# Patient Record
Sex: Female | Born: 1950 | Race: White | Hispanic: No | Marital: Married | Smoking: Never smoker
Health system: Southern US, Community
[De-identification: ages and names within clinical notes are randomized; demographics above are authoritative.]

## PROBLEM LIST (undated history)

## (undated) DIAGNOSIS — N200 Calculus of kidney: Secondary | ICD-10-CM

## (undated) DIAGNOSIS — E785 Hyperlipidemia, unspecified: Secondary | ICD-10-CM

## (undated) DIAGNOSIS — F329 Major depressive disorder, single episode, unspecified: Secondary | ICD-10-CM

## (undated) DIAGNOSIS — F32A Depression, unspecified: Secondary | ICD-10-CM

## (undated) DIAGNOSIS — M179 Osteoarthritis of knee, unspecified: Secondary | ICD-10-CM

## (undated) DIAGNOSIS — I10 Essential (primary) hypertension: Secondary | ICD-10-CM

## (undated) DIAGNOSIS — R42 Dizziness and giddiness: Secondary | ICD-10-CM

## (undated) DIAGNOSIS — N1831 Chronic kidney disease, stage 3a: Secondary | ICD-10-CM

## (undated) DIAGNOSIS — M1990 Unspecified osteoarthritis, unspecified site: Secondary | ICD-10-CM

## (undated) DIAGNOSIS — C801 Malignant (primary) neoplasm, unspecified: Secondary | ICD-10-CM

## (undated) DIAGNOSIS — M189 Osteoarthritis of first carpometacarpal joint, unspecified: Secondary | ICD-10-CM

## (undated) DIAGNOSIS — E7800 Pure hypercholesterolemia, unspecified: Secondary | ICD-10-CM

## (undated) DIAGNOSIS — F3342 Major depressive disorder, recurrent, in full remission: Secondary | ICD-10-CM

## (undated) DIAGNOSIS — G720 Drug-induced myopathy: Secondary | ICD-10-CM

## (undated) DIAGNOSIS — L309 Dermatitis, unspecified: Secondary | ICD-10-CM

## (undated) DIAGNOSIS — K625 Hemorrhage of anus and rectum: Secondary | ICD-10-CM

## (undated) DIAGNOSIS — M545 Low back pain, unspecified: Secondary | ICD-10-CM

## (undated) DIAGNOSIS — E113299 Type 2 diabetes mellitus with mild nonproliferative diabetic retinopathy without macular edema, unspecified eye: Secondary | ICD-10-CM

## (undated) DIAGNOSIS — E559 Vitamin D deficiency, unspecified: Secondary | ICD-10-CM

## (undated) DIAGNOSIS — E08319 Diabetes mellitus due to underlying condition with unspecified diabetic retinopathy without macular edema: Secondary | ICD-10-CM

## (undated) DIAGNOSIS — F409 Phobic anxiety disorder, unspecified: Secondary | ICD-10-CM

## (undated) DIAGNOSIS — K219 Gastro-esophageal reflux disease without esophagitis: Secondary | ICD-10-CM

## (undated) DIAGNOSIS — G43909 Migraine, unspecified, not intractable, without status migrainosus: Secondary | ICD-10-CM

## (undated) HISTORY — DX: Low back pain, unspecified: M54.50

## (undated) HISTORY — DX: Dizziness and giddiness: R42

## (undated) HISTORY — DX: Depression, unspecified: F32.A

## (undated) HISTORY — DX: Osteoarthritis of knee, unspecified: M17.9

## (undated) HISTORY — DX: Unspecified osteoarthritis, unspecified site: M19.90

## (undated) HISTORY — PX: INCONTINENCE SURGERY: SHX676

## (undated) HISTORY — DX: Major depressive disorder, recurrent, in full remission: F33.42

## (undated) HISTORY — DX: Malignant (primary) neoplasm, unspecified: C80.1

## (undated) HISTORY — PX: CHOLECYSTECTOMY: SHX55

## (undated) HISTORY — DX: Diabetes mellitus due to underlying condition with unspecified diabetic retinopathy without macular edema: E08.319

## (undated) HISTORY — PX: INSERTION OF TISSUE EXPANDER AFTER MASTECTOMY: SHX1831

## (undated) HISTORY — DX: Essential (primary) hypertension: I10

## (undated) HISTORY — DX: Osteoarthritis of first carpometacarpal joint, unspecified: M18.9

## (undated) HISTORY — PX: SPINE SURGERY: SHX786

## (undated) HISTORY — PX: TUBAL LIGATION: SHX77

## (undated) HISTORY — DX: Migraine, unspecified, not intractable, without status migrainosus: G43.909

## (undated) HISTORY — PX: JOINT REPLACEMENT: SHX530

## (undated) HISTORY — PX: APPENDECTOMY: SHX54

## (undated) HISTORY — DX: Chronic kidney disease, stage 3a: N18.31

## (undated) HISTORY — DX: Hemorrhage of anus and rectum: K62.5

## (undated) HISTORY — PX: BREAST RECONSTRUCTION: SHX9

## (undated) HISTORY — DX: Dermatitis, unspecified: L30.9

## (undated) HISTORY — PX: CERVICAL DISC SURGERY: SHX588

## (undated) HISTORY — DX: Vitamin D deficiency, unspecified: E55.9

## (undated) HISTORY — PX: FOREARM FRACTURE SURGERY: SHX649

## (undated) HISTORY — DX: Major depressive disorder, single episode, unspecified: F32.9

## (undated) HISTORY — PX: KNEE ARTHROSCOPY: SUR90

## (undated) HISTORY — PX: BREAST REDUCTION SURGERY: SHX8

## (undated) HISTORY — DX: Gastro-esophageal reflux disease without esophagitis: K21.9

## (undated) HISTORY — DX: Type 2 diabetes mellitus with mild nonproliferative diabetic retinopathy without macular edema, unspecified eye: E11.3299

## (undated) HISTORY — PX: BREAST SURGERY: SHX581

## (undated) HISTORY — DX: Hyperlipidemia, unspecified: E78.5

## (undated) HISTORY — PX: MASTECTOMY: SHX3

## (undated) HISTORY — DX: Phobic anxiety disorder, unspecified: F40.9

## (undated) HISTORY — DX: Drug-induced myopathy: G72.0

## (undated) HISTORY — PX: TOTAL KNEE ARTHROPLASTY: SHX125

## (undated) HISTORY — DX: Pure hypercholesterolemia, unspecified: E78.00

## (undated) HISTORY — PX: NEUROPLASTY / TRANSPOSITION MEDIAN NERVE AT CARPAL TUNNEL BILATERAL: SUR894

## (undated) HISTORY — PX: REDUCTION MAMMAPLASTY: SUR839

---

## 1994-11-26 DIAGNOSIS — C50919 Malignant neoplasm of unspecified site of unspecified female breast: Secondary | ICD-10-CM

## 1994-11-26 DIAGNOSIS — C801 Malignant (primary) neoplasm, unspecified: Secondary | ICD-10-CM

## 1994-11-26 HISTORY — DX: Malignant (primary) neoplasm, unspecified: C80.1

## 1994-11-26 HISTORY — DX: Malignant neoplasm of unspecified site of unspecified female breast: C50.919

## 1994-11-26 HISTORY — PX: MASTECTOMY: SHX3

## 1998-02-24 ENCOUNTER — Other Ambulatory Visit: Admission: RE | Admit: 1998-02-24 | Discharge: 1998-02-24 | Payer: Self-pay | Admitting: Plastic Surgery

## 1998-04-04 ENCOUNTER — Encounter: Admission: RE | Admit: 1998-04-04 | Discharge: 1998-07-03 | Payer: Self-pay | Admitting: Internal Medicine

## 1998-06-17 ENCOUNTER — Ambulatory Visit (HOSPITAL_COMMUNITY): Admission: RE | Admit: 1998-06-17 | Discharge: 1998-06-17 | Payer: Self-pay | Admitting: Surgery

## 1998-08-16 ENCOUNTER — Encounter: Admission: RE | Admit: 1998-08-16 | Discharge: 1998-11-14 | Payer: Self-pay | Admitting: Internal Medicine

## 1999-04-02 ENCOUNTER — Other Ambulatory Visit: Admission: RE | Admit: 1999-04-02 | Discharge: 1999-04-02 | Payer: Self-pay | Admitting: Internal Medicine

## 1999-05-23 ENCOUNTER — Ambulatory Visit (HOSPITAL_COMMUNITY): Admission: RE | Admit: 1999-05-23 | Discharge: 1999-05-23 | Payer: Self-pay | Admitting: Internal Medicine

## 1999-05-23 ENCOUNTER — Encounter: Payer: Self-pay | Admitting: Internal Medicine

## 1999-10-07 ENCOUNTER — Ambulatory Visit (HOSPITAL_COMMUNITY): Admission: RE | Admit: 1999-10-07 | Discharge: 1999-10-07 | Payer: Self-pay | Admitting: Neurosurgery

## 1999-10-07 ENCOUNTER — Encounter: Payer: Self-pay | Admitting: Neurosurgery

## 2000-04-23 ENCOUNTER — Encounter: Payer: Self-pay | Admitting: Emergency Medicine

## 2000-04-23 ENCOUNTER — Emergency Department (HOSPITAL_COMMUNITY): Admission: EM | Admit: 2000-04-23 | Discharge: 2000-04-23 | Payer: Self-pay | Admitting: Emergency Medicine

## 2000-05-16 ENCOUNTER — Other Ambulatory Visit: Admission: RE | Admit: 2000-05-16 | Discharge: 2000-05-16 | Payer: Self-pay | Admitting: Obstetrics and Gynecology

## 2000-05-24 ENCOUNTER — Encounter: Admission: RE | Admit: 2000-05-24 | Discharge: 2000-05-24 | Payer: Self-pay | Admitting: Internal Medicine

## 2000-05-24 ENCOUNTER — Encounter: Payer: Self-pay | Admitting: Internal Medicine

## 2000-12-29 ENCOUNTER — Ambulatory Visit (HOSPITAL_COMMUNITY): Admission: RE | Admit: 2000-12-29 | Discharge: 2000-12-29 | Payer: Self-pay | Admitting: Neurosurgery

## 2000-12-29 ENCOUNTER — Encounter: Payer: Self-pay | Admitting: Neurosurgery

## 2001-01-08 ENCOUNTER — Encounter: Payer: Self-pay | Admitting: Neurosurgery

## 2001-01-10 ENCOUNTER — Encounter: Payer: Self-pay | Admitting: Neurosurgery

## 2001-01-10 ENCOUNTER — Inpatient Hospital Stay (HOSPITAL_COMMUNITY): Admission: RE | Admit: 2001-01-10 | Discharge: 2001-01-12 | Payer: Self-pay | Admitting: Neurosurgery

## 2001-04-08 ENCOUNTER — Encounter: Admission: RE | Admit: 2001-04-08 | Discharge: 2001-04-08 | Payer: Self-pay | Admitting: Internal Medicine

## 2001-04-08 ENCOUNTER — Encounter: Payer: Self-pay | Admitting: Internal Medicine

## 2001-05-26 ENCOUNTER — Encounter: Payer: Self-pay | Admitting: Internal Medicine

## 2001-05-26 ENCOUNTER — Ambulatory Visit (HOSPITAL_COMMUNITY): Admission: RE | Admit: 2001-05-26 | Discharge: 2001-05-26 | Payer: Self-pay | Admitting: Internal Medicine

## 2001-08-30 ENCOUNTER — Ambulatory Visit (HOSPITAL_COMMUNITY): Admission: RE | Admit: 2001-08-30 | Discharge: 2001-08-30 | Payer: Self-pay | Admitting: Neurosurgery

## 2001-08-30 ENCOUNTER — Encounter: Payer: Self-pay | Admitting: Neurosurgery

## 2002-04-22 ENCOUNTER — Other Ambulatory Visit: Admission: RE | Admit: 2002-04-22 | Discharge: 2002-04-22 | Payer: Self-pay | Admitting: Internal Medicine

## 2002-05-27 ENCOUNTER — Ambulatory Visit (HOSPITAL_COMMUNITY): Admission: RE | Admit: 2002-05-27 | Discharge: 2002-05-27 | Payer: Self-pay | Admitting: Internal Medicine

## 2002-05-27 ENCOUNTER — Encounter: Payer: Self-pay | Admitting: Internal Medicine

## 2003-03-07 ENCOUNTER — Emergency Department (HOSPITAL_COMMUNITY): Admission: AD | Admit: 2003-03-07 | Discharge: 2003-03-07 | Payer: Self-pay | Admitting: Emergency Medicine

## 2003-03-20 ENCOUNTER — Encounter: Payer: Self-pay | Admitting: Neurosurgery

## 2003-03-20 ENCOUNTER — Ambulatory Visit (HOSPITAL_COMMUNITY): Admission: RE | Admit: 2003-03-20 | Discharge: 2003-03-20 | Payer: Self-pay | Admitting: Neurosurgery

## 2003-04-07 ENCOUNTER — Ambulatory Visit (HOSPITAL_COMMUNITY): Admission: RE | Admit: 2003-04-07 | Discharge: 2003-04-07 | Payer: Self-pay | Admitting: Neurosurgery

## 2003-04-07 ENCOUNTER — Encounter: Payer: Self-pay | Admitting: Neurosurgery

## 2003-06-10 ENCOUNTER — Ambulatory Visit (HOSPITAL_COMMUNITY): Admission: RE | Admit: 2003-06-10 | Discharge: 2003-06-10 | Payer: Self-pay | Admitting: Internal Medicine

## 2003-06-10 ENCOUNTER — Encounter: Payer: Self-pay | Admitting: Internal Medicine

## 2003-08-13 ENCOUNTER — Other Ambulatory Visit: Admission: RE | Admit: 2003-08-13 | Discharge: 2003-08-13 | Payer: Self-pay | Admitting: Internal Medicine

## 2004-06-13 ENCOUNTER — Ambulatory Visit (HOSPITAL_COMMUNITY): Admission: RE | Admit: 2004-06-13 | Discharge: 2004-06-13 | Payer: Self-pay | Admitting: Internal Medicine

## 2004-08-12 ENCOUNTER — Emergency Department (HOSPITAL_COMMUNITY): Admission: EM | Admit: 2004-08-12 | Discharge: 2004-08-12 | Payer: Self-pay | Admitting: Emergency Medicine

## 2004-08-21 ENCOUNTER — Other Ambulatory Visit: Admission: RE | Admit: 2004-08-21 | Discharge: 2004-08-21 | Payer: Self-pay | Admitting: Internal Medicine

## 2004-09-21 ENCOUNTER — Ambulatory Visit (HOSPITAL_BASED_OUTPATIENT_CLINIC_OR_DEPARTMENT_OTHER): Admission: RE | Admit: 2004-09-21 | Discharge: 2004-09-21 | Payer: Self-pay | Admitting: Orthopedic Surgery

## 2004-09-21 ENCOUNTER — Ambulatory Visit (HOSPITAL_COMMUNITY): Admission: RE | Admit: 2004-09-21 | Discharge: 2004-09-21 | Payer: Self-pay | Admitting: Orthopedic Surgery

## 2005-01-23 ENCOUNTER — Ambulatory Visit (HOSPITAL_COMMUNITY): Admission: RE | Admit: 2005-01-23 | Discharge: 2005-01-23 | Payer: Self-pay | Admitting: Orthopedic Surgery

## 2005-01-23 ENCOUNTER — Ambulatory Visit (HOSPITAL_BASED_OUTPATIENT_CLINIC_OR_DEPARTMENT_OTHER): Admission: RE | Admit: 2005-01-23 | Discharge: 2005-01-23 | Payer: Self-pay | Admitting: Orthopedic Surgery

## 2005-06-18 ENCOUNTER — Ambulatory Visit (HOSPITAL_COMMUNITY): Admission: RE | Admit: 2005-06-18 | Discharge: 2005-06-18 | Payer: Self-pay | Admitting: Surgery

## 2005-12-25 ENCOUNTER — Other Ambulatory Visit: Admission: RE | Admit: 2005-12-25 | Discharge: 2005-12-25 | Payer: Self-pay | Admitting: Internal Medicine

## 2006-07-02 ENCOUNTER — Ambulatory Visit (HOSPITAL_COMMUNITY): Admission: RE | Admit: 2006-07-02 | Discharge: 2006-07-02 | Payer: Self-pay | Admitting: Obstetrics and Gynecology

## 2007-04-28 ENCOUNTER — Other Ambulatory Visit: Admission: RE | Admit: 2007-04-28 | Discharge: 2007-04-28 | Payer: Self-pay | Admitting: Internal Medicine

## 2007-07-29 ENCOUNTER — Ambulatory Visit (HOSPITAL_COMMUNITY): Admission: RE | Admit: 2007-07-29 | Discharge: 2007-07-29 | Payer: Self-pay | Admitting: Surgery

## 2008-03-31 ENCOUNTER — Inpatient Hospital Stay (HOSPITAL_COMMUNITY): Admission: RE | Admit: 2008-03-31 | Discharge: 2008-04-04 | Payer: Self-pay | Admitting: Specialist

## 2008-04-30 ENCOUNTER — Other Ambulatory Visit: Admission: RE | Admit: 2008-04-30 | Discharge: 2008-04-30 | Payer: Self-pay | Admitting: Internal Medicine

## 2008-06-21 ENCOUNTER — Encounter: Admission: RE | Admit: 2008-06-21 | Discharge: 2008-06-21 | Payer: Self-pay | Admitting: Specialist

## 2008-07-30 ENCOUNTER — Ambulatory Visit (HOSPITAL_COMMUNITY): Admission: RE | Admit: 2008-07-30 | Discharge: 2008-07-30 | Payer: Self-pay | Admitting: Family Medicine

## 2009-11-02 ENCOUNTER — Ambulatory Visit (HOSPITAL_COMMUNITY): Admission: RE | Admit: 2009-11-02 | Discharge: 2009-11-02 | Payer: Self-pay | Admitting: Internal Medicine

## 2009-11-04 ENCOUNTER — Encounter: Admission: RE | Admit: 2009-11-04 | Discharge: 2009-11-04 | Payer: Self-pay | Admitting: Obstetrics and Gynecology

## 2010-10-25 ENCOUNTER — Encounter: Admission: RE | Admit: 2010-10-25 | Discharge: 2010-10-25 | Payer: Self-pay | Admitting: Internal Medicine

## 2010-11-16 ENCOUNTER — Encounter
Admission: RE | Admit: 2010-11-16 | Discharge: 2010-11-16 | Payer: Self-pay | Source: Home / Self Care | Attending: Internal Medicine | Admitting: Internal Medicine

## 2010-12-21 LAB — BASIC METABOLIC PANEL
BUN: 14 mg/dL (ref 6–23)
CO2: 30 mEq/L (ref 19–32)
Calcium: 9.8 mg/dL (ref 8.4–10.5)
Chloride: 103 mEq/L (ref 96–112)
Creatinine, Ser: 0.76 mg/dL (ref 0.4–1.2)
GFR calc Af Amer: >60 mL/min (ref >60)
GFR calc non Af Amer: >60 mL/min (ref >60)
Glucose, Bld: 259 mg/dL — ABNORMAL HIGH (ref 70–99)
Potassium: 4.9 mEq/L (ref 3.5–5.1)
Sodium: 139 mEq/L (ref 135–145)

## 2010-12-21 LAB — CBC
MCV: 85.5 fL (ref 78.0–100.0)
RBC: 4.97 MIL/uL (ref 3.87–5.11)
RDW: 13.5 % (ref 11.5–15.5)

## 2010-12-21 LAB — DIFFERENTIAL
Basophils Relative: 1 % (ref 0–1)
Eosinophils Absolute: 0.8 10*3/uL — ABNORMAL HIGH (ref 0.0–0.7)
Neutro Abs: 4.1 10*3/uL (ref 1.7–7.7)
Neutrophils Relative %: 51 % (ref 43–77)

## 2010-12-21 LAB — SURGICAL PCR SCREEN
MRSA, PCR: NEGATIVE
Staphylococcus aureus: NEGATIVE

## 2010-12-21 LAB — TYPE AND SCREEN
ABO/RH(D): A POS
Antibody Screen: NEGATIVE

## 2010-12-26 ENCOUNTER — Inpatient Hospital Stay (HOSPITAL_COMMUNITY)
Admission: RE | Admit: 2010-12-26 | Discharge: 2011-01-02 | DRG: 460 | Disposition: A | Discharge status: Home Health | Payer: Medicare Other | Attending: Neurosurgery | Admitting: Neurosurgery

## 2010-12-26 DIAGNOSIS — K219 Gastro-esophageal reflux disease without esophagitis: Secondary | ICD-10-CM | POA: Diagnosis present

## 2010-12-26 DIAGNOSIS — M4310 Spondylolisthesis, site unspecified: Secondary | ICD-10-CM | POA: Diagnosis present

## 2010-12-26 DIAGNOSIS — F3289 Other specified depressive episodes: Secondary | ICD-10-CM | POA: Diagnosis present

## 2010-12-26 DIAGNOSIS — F329 Major depressive disorder, single episode, unspecified: Secondary | ICD-10-CM | POA: Diagnosis present

## 2010-12-26 DIAGNOSIS — M5126 Other intervertebral disc displacement, lumbar region: Principal | ICD-10-CM | POA: Diagnosis present

## 2010-12-26 DIAGNOSIS — M47817 Spondylosis without myelopathy or radiculopathy, lumbosacral region: Secondary | ICD-10-CM | POA: Diagnosis present

## 2010-12-26 DIAGNOSIS — I10 Essential (primary) hypertension: Secondary | ICD-10-CM | POA: Diagnosis present

## 2010-12-26 DIAGNOSIS — E119 Type 2 diabetes mellitus without complications: Secondary | ICD-10-CM | POA: Diagnosis present

## 2010-12-26 LAB — GLUCOSE, CAPILLARY: Glucose-Capillary: 152 mg/dL — ABNORMAL HIGH (ref 70–99)

## 2010-12-27 LAB — GLUCOSE, CAPILLARY
Glucose-Capillary: 217 mg/dL — ABNORMAL HIGH (ref 70–99)
Glucose-Capillary: 187 mg/dL — ABNORMAL HIGH (ref 70–99)

## 2010-12-28 LAB — GLUCOSE, CAPILLARY
Glucose-Capillary: 241 mg/dL — ABNORMAL HIGH (ref 70–99)
Glucose-Capillary: 223 mg/dL — ABNORMAL HIGH (ref 70–99)

## 2010-12-28 NOTE — Op Note (Addendum)
[name], [name]              ACCOUNT NO.:  [id number]  MEDICAL RECORD NO.:  [id number]          PATIENT TYPE:  INP  LOCATION:  3010                         FACILITY:  [facility]  PHYSICIAN:  Danae Orleans. Venetia Maxon, M.D.  DATE OF BIRTH:  1951-08-22  DATE OF PROCEDURE:  12/26/2010 DATE OF DISCHARGE:                              OPERATIVE REPORT   PREOPERATIVE DIAGNOSES:  Recurrent herniated lumbar disk with spondylosis, degenerative disk disease, and radiculopathy L2-3, L3-4, L4- 5 level.  POSTOPERATIVE DIAGNOSES:  Recurrent herniated lumbar disk with spondylosis, degenerative disk disease, and radiculopathy L2-3, L3-4, L4- 5 level.  PROCEDURE: 1. L2-3, L3-4, L4-5 redo laminectomy, and diskectomy. 2. Transforaminal lumbar interbody fusion with PEEK interbody cages,     morselized bone autograft, Puragen, and allograft. 3. Pedicle screw fixation L2-L5 levels bilaterally. 4. Posterolateral arthrodesis L2-L5 levels.  SURGEON:  Danae Orleans. Venetia Maxon, MD  ASSISTANTRejeana Brock, RN and Stefani Dama, MD  ANESTHESIA:  General endotracheal anesthesia.  ESTIMATED BLOOD LOSS:  550 mL of blood with 170 mL of Cell Saver blood returned to the patient.  COMPLICATIONS:  None.  DISPOSITION:  Recovery.  INDICATIONS:  [name] is a 60 year old woman with severe spondylosis and spondylolisthesis of L3-L4 with large calcified disk herniation L2-3 on the right with severe right leg pain and recurrent disk herniation L4-5 on the right, elected to take her to surgery for redo decompression and fusion at these affected levels.  PROCEDURE:  [name] was brought to the operating room.  Following satisfactory and uncomplicated induction of general endotracheal anesthesia plus intravenous lines and Foley catheter, the patient was placed in prone position on the [place] table.  Soft tissue and bony prominences were padded appropriately.  The area of planned incision was infiltrated with local  lidocaine.  Incision was made in the midline, carried from L2-L5 levels.  Transverse processes of L3, L4, and L5 were exposed bilaterally.  The right laminectomy at L2, L3, and L4 was performed.  There was significant scarring around the thecal sac at the L4-5 level on the right and this was carefully dissected.  A thorough diskectomy was performed and eventually after preparation of endplates and a thorough decompression of the nerve roots, more extensive for typical decompression and fusion, a 9-mm PEEK interbody spacer was packed with Puragen with allograft and also the autograft was placed at the interspace and countersunk and the implant was countersunk appropriately.  At the L3-4 level, a thorough diskectomy was performed. Decompression from the right side was performed.  Contralateral distraction was also performed and the endplates were prepared and a 10 mm medium PEEK interbody cage was again packed with Puragen and allograft.  Additional autograft was placed deep within the interspace and countersunk appropriately.  An implant was placed and countersunk appropriately.  At the L2-3 level, there was thorough decompression and diskectomy was performed.  The disk herniation was markedly calcified and adherent to the dura.  With painstaking dissection, the dura was separated from these calcified disk herniation.  A small opening in the arachnoid was identified.  The thorough diskectomy was performed and after trial sizing, a 10-mm  a PEEK interbody cage was again packed in similar fashion with autograft deep in the interspace and this implant was then positioned and countersunk appropriately.  There was some CSF leakage at this point from the exposed arachnoid and DuraGen was placed. Subsequently, Dura Seal was placed with no evidence of any persistent CSF leakage.  Pedicle screws were then placed using 6.5 x 45-mm screws at L2, L3, L4, and 6.5 x 40-mm screws at L5.  All screws had  excellent purchase.  The position was confirmed on AP and lateral fluoroscopy. The posterolateral region was extensively decorticated along with the facet joint complexes on the left and remaining bone autograft and Vitoss were then placed and tamped into position.  A 110-mm rods were lordosed appropriately and affixed to the screw heads.  Locking caps were placed and counter torqued appropriately.  The wound was closed after it had been irrigated prior to placing final bone graft and closed with 1 Vicryl sutures, reapproximated fascia, 2-0 Vicryl subcutaneous tissues, and 3-0 nylon running lock stitch, reapproximated skin edges. The patient was extubated in the operating room and taken to recovery in stable satisfactory condition having tolerated the operation well. Counts were correct at the end of the case.     Danae Orleans. Venetia Maxon, M.D.     JDS/MEDQ  D:  12/26/2010  T:  12/27/2010  Job:  [id number]  Electronically Signed by Maeola Harman M.D. on 12/28/2010 02:07:54 PM

## 2010-12-29 LAB — GLUCOSE, CAPILLARY: Glucose-Capillary: 250 mg/dL — ABNORMAL HIGH (ref 70–99)

## 2010-12-30 LAB — GLUCOSE, CAPILLARY
Glucose-Capillary: 196 mg/dL — ABNORMAL HIGH (ref 70–99)
Glucose-Capillary: 189 mg/dL — ABNORMAL HIGH (ref 70–99)
Glucose-Capillary: 222 mg/dL — ABNORMAL HIGH (ref 70–99)

## 2010-12-31 LAB — GLUCOSE, CAPILLARY
Glucose-Capillary: 163 mg/dL — ABNORMAL HIGH (ref 70–99)
Glucose-Capillary: 177 mg/dL — ABNORMAL HIGH (ref 70–99)
Glucose-Capillary: 139 mg/dL — ABNORMAL HIGH (ref 70–99)

## 2011-01-01 LAB — GLUCOSE, CAPILLARY
Glucose-Capillary: 190 mg/dL — ABNORMAL HIGH (ref 70–99)
Glucose-Capillary: 212 mg/dL — ABNORMAL HIGH (ref 70–99)

## 2011-02-06 ENCOUNTER — Observation Stay (HOSPITAL_COMMUNITY)
Admission: EM | Admit: 2011-02-06 | Discharge: 2011-02-07 | Disposition: A | Discharge status: Home / Self Care | Payer: Medicare Other | Attending: Internal Medicine | Admitting: Internal Medicine

## 2011-02-06 ENCOUNTER — Emergency Department (HOSPITAL_COMMUNITY): Payer: Medicare Other

## 2011-02-06 DIAGNOSIS — R0609 Other forms of dyspnea: Secondary | ICD-10-CM | POA: Insufficient documentation

## 2011-02-06 DIAGNOSIS — R61 Generalized hyperhidrosis: Secondary | ICD-10-CM | POA: Insufficient documentation

## 2011-02-06 DIAGNOSIS — F411 Generalized anxiety disorder: Secondary | ICD-10-CM | POA: Insufficient documentation

## 2011-02-06 DIAGNOSIS — K219 Gastro-esophageal reflux disease without esophagitis: Secondary | ICD-10-CM | POA: Insufficient documentation

## 2011-02-06 DIAGNOSIS — E785 Hyperlipidemia, unspecified: Secondary | ICD-10-CM | POA: Insufficient documentation

## 2011-02-06 DIAGNOSIS — R0789 Other chest pain: Principal | ICD-10-CM | POA: Insufficient documentation

## 2011-02-06 DIAGNOSIS — I10 Essential (primary) hypertension: Secondary | ICD-10-CM | POA: Insufficient documentation

## 2011-02-06 DIAGNOSIS — R0602 Shortness of breath: Secondary | ICD-10-CM | POA: Insufficient documentation

## 2011-02-06 DIAGNOSIS — F3289 Other specified depressive episodes: Secondary | ICD-10-CM | POA: Insufficient documentation

## 2011-02-06 DIAGNOSIS — E669 Obesity, unspecified: Secondary | ICD-10-CM | POA: Insufficient documentation

## 2011-02-06 DIAGNOSIS — F329 Major depressive disorder, single episode, unspecified: Secondary | ICD-10-CM | POA: Insufficient documentation

## 2011-02-06 DIAGNOSIS — E119 Type 2 diabetes mellitus without complications: Secondary | ICD-10-CM | POA: Insufficient documentation

## 2011-02-06 DIAGNOSIS — D649 Anemia, unspecified: Secondary | ICD-10-CM | POA: Insufficient documentation

## 2011-02-06 DIAGNOSIS — R0989 Other specified symptoms and signs involving the circulatory and respiratory systems: Secondary | ICD-10-CM | POA: Insufficient documentation

## 2011-02-06 DIAGNOSIS — Z853 Personal history of malignant neoplasm of breast: Secondary | ICD-10-CM | POA: Insufficient documentation

## 2011-02-06 LAB — POCT CARDIAC MARKERS
CKMB, poc: 1.2 ng/mL (ref 1.0–8.0)
CKMB, poc: 1.6 ng/mL (ref 1.0–8.0)
Myoglobin, poc: 120 ng/mL (ref 12–200)
Myoglobin, poc: 107 ng/mL (ref 12–200)

## 2011-02-06 LAB — DIFFERENTIAL
Basophils Absolute: 0 10*3/uL (ref 0.0–0.1)
Basophils Relative: 1 % (ref 0–1)
Eosinophils Absolute: 0.2 10*3/uL (ref 0.0–0.7)
Eosinophils Relative: 3 % (ref 0–5)
Lymphocytes Relative: 33 % (ref 12–46)
Monocytes Absolute: 0.7 10*3/uL (ref 0.1–1.0)

## 2011-02-06 LAB — BASIC METABOLIC PANEL
BUN: 8 mg/dL (ref 6–23)
CO2: 25 mEq/L (ref 19–32)
Calcium: 9.2 mg/dL (ref 8.4–10.5)
Creatinine, Ser: 0.64 mg/dL (ref 0.4–1.2)
GFR calc non Af Amer: >60 mL/min (ref >60)
Glucose, Bld: 167 mg/dL — ABNORMAL HIGH (ref 70–99)
Sodium: 139 mEq/L (ref 135–145)

## 2011-02-06 LAB — CBC
HCT: 33.3 % — ABNORMAL LOW (ref 36.0–46.0)
MCHC: 33 g/dL (ref 30.0–36.0)
Platelets: 266 10*3/uL (ref 150–400)
RDW: 13.5 % (ref 11.5–15.5)
WBC: 7.2 10*3/uL (ref 4.0–10.5)

## 2011-02-06 LAB — CK TOTAL AND CKMB (NOT AT ARMC): CK, MB: 4 ng/mL (ref 0.3–4.0)

## 2011-02-06 MED ORDER — IOHEXOL 300 MG/ML  SOLN
100.0000 mL | Freq: Once | INTRAMUSCULAR | Status: AC | PRN
  Administered 2011-02-06: 100 mL via INTRAVENOUS

## 2011-02-06 NOTE — Discharge Summary (Signed)
  NAMEFAITHLYNN, DEELEY              ACCOUNT NO.:  [id number]  MEDICAL RECORD NO.:  [id number]           PATIENT TYPE:  I  LOCATION:  3010                         FACILITY:  [facility]  PHYSICIAN:  Danae Orleans. Venetia Maxon, M.D.  DATE OF BIRTH:  [birth year]  DATE OF ADMISSION:  12/26/2010 DATE OF DISCHARGE:  01/02/2011                              DISCHARGE SUMMARY   REASON FOR ADMISSION:  [name] is a 60 year old woman with severe lumbar spondylosis and spondylolisthesis at L3-L4 with a large calcified disk herniation at L2-L3 on the right with severe right-sided leg pain and recurrent disk herniation at L4-L5 on the right.  She has had multiple prior lumbar laminectomy and diskectomy surgeries done by other physicians.  It was elected to admit her for with a diagnosis of recurrent disk herniation, spondylosis, degenerative disk disease, and radiculopathy at L2-L3, L3-L4, and L4-L5 levels.  HOSPITAL COURSE:  The patient underwent decompression and fusion at these levels with interbody graft and pedicle screw fixation.  Her dura at the L2-L3 level was markedly adherent to the calcified disk herniation and a small opening was created in the dura with CSF leak. It was felt that this was not repairable as this was on the ventral surface of the thecal sac and it was covered with Duragen and DuraSeal. It was elected to have the patient lay flat for several days to allow this to heal prior to mobilization.  The patient did well with that, was on bedrest for 72 hours, and then was gradually mobilized, had a Foley catheter discontinued and was doing well in terms of back pain and was mobilized with physical therapy and was then discharged home on the morning of January 31, 2011 with home health physical and occupational therapy with discharge medications of Percocet 5/325 one to two every 4 hours as needed for pain and Valium 5 mg every 8 hours as needed for muscular spasm.  She is to call for an  appointment in a week for suture removal.  The patient did well and was discharged home with instructions to follow up in 1 week for suture removal.     Danae Orleans. Venetia Maxon, M.D.     JDS/MEDQ  D:  02/02/2011  T:  02/03/2011  Job:  [id number]  Electronically Signed by Maeola Harman M.D. on 02/06/2011 07:33:16 AM

## 2011-02-07 ENCOUNTER — Observation Stay (HOSPITAL_COMMUNITY): Payer: Medicare Other

## 2011-02-07 LAB — CARDIAC PANEL(CRET KIN+CKTOT+MB+TROPI)
CK, MB: 3.4 ng/mL (ref 0.3–4.0)
Relative Index: INVALID (ref 0.0–2.5)
Total CK: 202 U/L — ABNORMAL HIGH (ref 7–177)
Total CK: 122 U/L (ref 7–177)
Total CK: 99 U/L (ref 7–177)
Troponin I: 0.01 ng/mL (ref 0.00–0.06)

## 2011-02-07 LAB — HEMOGLOBIN A1C
Hgb A1c MFr Bld: 7.4 % — ABNORMAL HIGH (ref <5.7)
Mean Plasma Glucose: 166 mg/dL — ABNORMAL HIGH (ref <117)

## 2011-02-07 LAB — BASIC METABOLIC PANEL
CO2: 29 mEq/L (ref 19–32)
Calcium: 8.8 mg/dL (ref 8.4–10.5)
GFR calc Af Amer: >60 mL/min (ref >60)
GFR calc non Af Amer: >60 mL/min (ref >60)
Potassium: 3.4 mEq/L — ABNORMAL LOW (ref 3.5–5.1)
Sodium: 142 mEq/L (ref 135–145)

## 2011-02-07 LAB — LIPID PANEL
HDL: 41 mg/dL (ref >39)
LDL Cholesterol: 48 mg/dL (ref 0–99)
Triglycerides: 164 mg/dL — ABNORMAL HIGH (ref <150)
VLDL: 33 mg/dL (ref 0–40)

## 2011-02-07 LAB — GLUCOSE, CAPILLARY: Glucose-Capillary: 168 mg/dL — ABNORMAL HIGH (ref 70–99)

## 2011-02-07 MED ORDER — TECHNETIUM TC 99M TETROFOSMIN IV KIT
10.0000 | PACK | Freq: Once | INTRAVENOUS | Status: AC | PRN
  Administered 2011-02-07: 10 via INTRAVENOUS

## 2011-02-07 MED ORDER — TECHNETIUM TC 99M TETROFOSMIN IV KIT
30.0000 | PACK | Freq: Once | INTRAVENOUS | Status: AC | PRN
  Administered 2011-02-07: 30 via INTRAVENOUS

## 2011-02-08 ENCOUNTER — Other Ambulatory Visit (HOSPITAL_COMMUNITY): Payer: BC Managed Care – PPO

## 2011-02-08 NOTE — Consult Note (Signed)
[name], MANOCCHIO              ACCOUNT NO.:  [id number]  MEDICAL RECORD NO.:  [id number]           PATIENT TYPE:  I  LOCATION:  3732                         FACILITY:  [facility]  PHYSICIAN:  Armanda Magic, M.D.     DATE OF BIRTH:  03/17/1951  DATE OF CONSULTATION:  02/06/2011 DATE OF DISCHARGE:                                CONSULTATION   REFERRING PHYSICIAN:  Celene Kras, MD  CHIEF COMPLAINT:  Chest pain.  HISTORY OF PRESENT ILLNESS:  This is a 60 year old white female with no prior cardiac history who has been in her usual state of health until today around 11:15 a.m. while she was standing watching her grandchild when she developed sudden onset of sharp midsternal chest pain with radiation across her chest, much worse with deep breathing.  She said it did not radiate into her neck, but her left arm was aching some as well. She complained of some shortness of breath right before the pain started associated with nausea and diaphoresis.  She called EMS and upon EMS arrival she was given one sublingual nitroglycerin with improvement of pain and subsequently had another sublingual nitroglycerin in the emergency room and is currently pain free.  PAST MEDICAL HISTORY: 1. Obesity. 2. Hypertension. 3. Dyslipidemia. 4. Diabetes. 5. Depression. 6. Breast CA.  PAST SURGICAL HISTORY:  Recent back surgery in January as well as tubal ligation, cholecystectomy, appendectomy, HNP surgery on her low back, bladder tuck, HNP surgery on cervical disk, bilateral carpal tunnel release, partial mastectomy on the right breast, full mastectomy on the right breast, reconstructive right breast surgery, left breast reduction surgery, right breast implant, arthroscopy of left knee, repeat lower back diskectomy, and ORIF of right wrist fracture, and ulnar shortening of the right wrist.  FAMILY HISTORY:  Significant for diabetes, hypertension, and cancer. Her mother died of possible cardiac  arrest, although she is unsure.  Her father has an abdominal aortic aneurysm and is alive with irregular heartbeat.  SOCIAL HISTORY:  She is married.  She is retired on disability.  She does not smoke or drink alcohol.  MEDICATIONS:  She is unsure of at this time.  She did not bring her list with her.  REVIEW OF SYSTEMS:  Other than what is stated in the HPI is negative.  PHYSICAL EXAMINATION:  VITAL SIGNS:  Blood pressure is 116/50, heart rate 75, O2 saturation 100% on 2 L nasal cannula. GENERAL:  Well-developed obese white female in no acute distress. HEENT:  Benign. NECK:  Supple without lymphadenopathy.  Carotid upstrokes are +2 bilaterally.  No bruits. LUNGS:  Clear to auscultation throughout. HEART:  Regular rate and rhythm.  No murmurs, rubs, or gallops.  Normal S1 and S2. ABDOMEN:  Soft, nontender, nondistended.  Positive bowel sounds.  No hepatosplenomegaly. EXTREMITIES:  No cyanosis, erythema, or edema.  LABORATORY DATA:  Sodium 139, potassium 3.7, chloride 105, bicarb 25, BUN 8, creatinine 0.64.  Point-of-care markers negative x1.  White cell count 7.2, hemoglobin 11, hematocrit 33.3, platelet count 266.  Chest x- ray shows no active disease.  Chest CT shows no evidence of PE.  ASSESSMENT: 1. Atypical  chest pain, which is very sharp in nature and much worse     with deep breathing.  Chest CT showed no evidence of PE.  Cardiac     point-of-care markers are negative x1 and EKG done in the emergency     room shows sinus rhythm with no ST changes.  There is poor R-wave     progression in V1 and V2, which could be due to lead placement     versus prior septal infarct.  Cardiac risk factors include obesity,     hypertension, dyslipidemia, diabetes mellitus.  She is currently     pain free. 2. Hypertension. 3. Dyslipidemia. 4. Diabetes mellitus. 5. Depression. 6. Obesity. 7. History of breast carcinoma.  PLAN:  Admission per [facility].  We will cycle cardiac  enzymes q.8 h. x3.  We will make n.p.o. after midnight for Lexiscan Cardiolite in the morning if her cardiac enzymes are all negative.  We will continue IV heparin drip for now and discontinue IV nitroglycerin drip since she is pain free and continue on nitro paste.  We will continue on aspirin.     Armanda Magic, M.D.     TT/MEDQ  D:  02/06/2011  T:  02/07/2011  Job:  [id number]  cc:   Candyce Churn, M.D.  Electronically Signed by Armanda Magic M.D. on 02/08/2011 03:37:55 PM

## 2011-02-13 NOTE — H&P (Signed)
[name], [name]              ACCOUNT NO.:  [id number]  MEDICAL RECORD NO.:  [id number]           PATIENT TYPE:  E  LOCATION:  [facility]                         FACILITY:  [facility]  PHYSICIAN:  Homero Fellers, MD   DATE OF BIRTH:  02/08/1951  DATE OF ADMISSION:  02/06/2011 DATE OF DISCHARGE:                             HISTORY & PHYSICAL   PRIMARY CARE PHYSICIAN:  Unassigned.  CHIEF COMPLAINT:  Chest pain.  HISTORY OF PRESENT ILLNESS:  This is a 60 year old Caucasian woman, who developed epigastric like chest pain about 5 hours ago radiating to left breast and left upper arm, accompanying with diaphoresis, but no nausea or vomiting.  She also had mild shortness of breath.  In the emergency room, she was given some nitro paste inch to her chest wall with little improvement of her pain and subsequently placed on nitroglycerin drip. The patient has been seen by Cardiology who recommended admitting to medical service and they will consult if needed.  The patient had a stress test few years ago, which was inconclusive.  She has never been diagnosed with myocardial infarction or stroke in the past, even though she has risk factors.  Initial EKG and cardiac enzyme are nondiagnostic. She is currently almost chest pain-free.  She described her pain as getting worse when she takes a deep breath.  There is no cough, sputum production, or fever.  PAST MEDICAL HISTORY:  Significant for high blood pressure, diabetes mellitus, history of left breast cancer status post mastectomy several years ago, hyperlipidemia, history of degenerative disk disease status post back surgery few weeks ago, history of chronic back pain, and gastroesophageal reflux disease.  MEDICATIONS:  Actos, Effexor, glyburide, Micardis, Percocet, Valium. The patient cannot remember other medications.  ALLERGIES:  TO MORPHINE, SULFA, VICODIN, AND ERYTHROMYCIN.  SOCIAL HISTORY:  No smoking.  No alcohol or drugs.  FAMILY  HISTORY:  Positive for stroke in mother and history of atrial fibrillation in father.  REVIEW OF SYSTEMS:  The 10-point review of systems is negative except as described above.  PHYSICAL EXAM:  VITAL SIGNS:  Blood pressure is 116/50, pulse 71, respirations 18, temperature is unavailable, O2 sat is 100% on 2 L. GENERAL:  The patient is lying in bed, comfortable at this time, in no respiratory distress. HEENT:  PERRLA.  Extraocular muscles are intact. NECK:  Supple.  No JVD, adenopathy, or thyromegaly. LUNGS:  Clear to auscultation bilaterally.  No wheezing or any added sounds. HEART:  S1 and S2, regular rate and rhythm.  No murmurs, rubs, or gallop. ABDOMEN:  Obese, soft, and nontender.  Bowel sounds present.  No masses. EXTREMITIES:  Trace edema bilaterally. SKIN:  No rash or lesion. NEUROLOGIC:  No focal deficit identified.  Cranial nerves II through XII are intact.  Speech is clear.  LABORATORY:  White count is normal at 7.2, hemoglobin 11, platelet count is 266.  Chemistry, sodium is 139, potassium 3.7, BUN 8, creatinine 0.64.  Chest x-ray showed no acute cardiopulmonary disease.  She had a CT of the chest done, which showed no evidence of pulmonary embolism. Initial set of cardiac enzyme are negative.  EKG showed normal sinus rhythm with no evidence of ischemia.  ASSESSMENT: 1. This is a 60 year old to woman with risk factors for coronary     artery disease with no recent cardiac workup, admitted with chest     pain to rule out acute coronary syndrome. 2. Diabetes mellitus, which is fairly optimized. 3. Hyperlipidemia. 4. High blood pressure, which is controlled. 5. Obesity.  PLAN:  Admit to telemetry as observation, do serial cardiac enzyme and EKG.  The patient will get a nuclear stress test if enzymes are negative.  I will check a lipid profile and TSH.  All medicines will be continued for symptoms.  She will be on nitro paste and pain medication. I will also put on  proton pump inhibitors since the symptoms could also be coming from reflux.  The patient will also be on DVT prophylaxis.     Homero Fellers, MD     FA/MEDQ  D:  02/06/2011  T:  02/06/2011  Job:  [id number]  Electronically Signed by Homero Fellers  on 02/13/2011 02:10:48 AM

## 2011-02-14 NOTE — Discharge Summary (Signed)
[name], [name]              ACCOUNT NO.:  [id number]  MEDICAL RECORD NO.:  [id number]           PATIENT TYPE:  O  LOCATION:  3732                         FACILITY:  [facility]  PHYSICIAN:  Marcellus Scott, MD     DATE OF BIRTH:  June 22, 1951  DATE OF ADMISSION:  02/06/2011 DATE OF DISCHARGE:  02/07/2011                              DISCHARGE SUMMARY   PRIMARY CARE PHYSICIAN:  Candyce Churn, MD.  CARDIOLOGIST:  Armanda Magic, MD.  DISCHARGE DIAGNOSES: 1. Atypical chest pain.  Negative stress test and ruled out for     pulmonary embolism.  Resolved. 2. Hypertension. 3. Type 2 diabetes mellitus. 4. Dyslipidemia. 5. Depression. 6. Anxiety. 7. Obesity. 8. History of breast cancer. 9. History of chronic back pain, status post back surgery. 10.Gastroesophageal reflux disease. 11.Anemia.  DISCHARGE MEDICATIONS: 1. Enteric-coated aspirin 81 mg p.o. daily. 2. Actos 45 mg tablet, half tablet p.o. daily. 3. Calcium carbonate chews over-the-counter 1 tablet p.o. b.i.d. 4. CO-Q-10, 100 mg over-the-counter 1 tablet p.o. q.p.m. 5. Crestor 20 mg tablet, half tablet p.o. q.p.m. 6. Diltiazem CD 180 mg p.o. daily. 7. Furosemide 20 mg p.o. daily. 8. Glyburide 5 mg p.o. q.p.m. 9. Micardis 80 mg tablet, half tablet p.o. q.p.m. 10.Multivitamins over-the-counter 1 tablet p.o. daily. 11.Omeprazole 20 mg p.o. q.a.m. 12.Percocet 5/325 mg, 1-2 tablets p.o. q.4 h. p.r.n. for pain. 13.Valium 5 mg tablet, 1 tablet p.o. q.8 h. p.r.n. for muscle spasms. 14.Venlafaxine 100 mg p.o. b.i.d. 15.Vitamin D3 1000 units p.o. b.i.d.  PROCEDURES:  Lexiscan Myoview.  Impression is normal myocardial perfusion study.  Left ventricular ejection fraction is likely overestimated at 81%.  IMAGING: 1. CT angiogram of the chest.  Impression:     a.     No CT findings for pulmonary embolism.     b.     Normal thoracic aorta.     c.     No acute pulmonary findings. 2. Chest x-ray.  Impression:  No acute  cardiopulmonary disease.  LABORATORY DATA:  Cardiac enzymes were cycled and negative.  Lipid panel significant for triglycerides of 164, HDL 41, LDL 48, cholesterol 122, and VLDL 33.  Basic metabolic panel significant for potassium of 3.4, glucose of 143, otherwise within normal limits.  TSH 0.806. Hemoglobin A1c 7.4.  CBC:  Hemoglobin 11, hematocrit 33, white blood cells 7.2, and platelets 266.  CONSULTATIONS:  Cardiology, Dr. Armanda Magic.  DIET:  Diabetic and heart-healthy diet.  ACTIVITY:  As tolerated.  COMPLAINTS:  None.  PHYSICAL EXAMINATION:  GENERAL:  Patient is overweight, obese female patient who is in no obvious distress. VITAL SIGNS:  Telemetry shows sinus rhythm in the 80s with no arrhythmia alarms.  Temperature is 97.3 degrees Fahrenheit, pulse 87 per minute and regular, respirations 20 per minute, blood pressure 123/71 mmHg, and saturating at 99% on 2 L of oxygen.  RESPIRATORY:  Clear.  No increased work of breathing. CARDIOVASCULAR:  First and second heart sounds heard, regular.  No murmurs or JVD. ABDOMEN:  Obese, nontender, no organomegaly or mass appreciated.  Bowel sounds normally heard. CENTRAL NERVOUS SYSTEM:  Patient is awake, alert, and oriented  x3 with no focal neurological deficits. EXTREMITIES:  With grade 5/5 power.  HOSPITAL COURSE BY PROBLEMS:  [name] is a 60 year old Caucasian female patient with history of type 2 diabetes mellitus, hypertension, dyslipidemia, chronic back pain status post back surgery, and ongoing home or social stressors who is currently not living in her home but living with her son.  She presented with sharp midsternal chest pain with radiation across the chest, worse with deep breathing.  She denied radiating to the neck, but her left arm was aching.  She complained of some dyspnea.  She was given sublingual nitroglycerin by EMS with improvement of pain, and she was admitted to telemetry for further evaluation and  management.  1. Atypical chest pain.  The patient was admitted to telemetry.     Cardiac enzymes were cycled and negative.  CT chest ruled out     pulmonary embolism.  Given her cardiac risk factors including     obesity, hypertension, diabetes, and dyslipidemia, Cardiology was     consulted who performed a stress test which was negative.  I     discussed her case with Dr. Eldridge Dace who has cleared her from a     cardiac standpoint for discharge.  She is to call the cardiologist     for an outpatient followup. 2. Hypertension.  Reasonably controlled. 3. Type 2 diabetes mellitus, may require slightly more tighter     control. 4. Dyslipidemia.  Continue her home medication. 5. Depression and anxiety.  The patient seems to be undergoing some     social stressors at home.  She has been reassured.  She says she     has only been taking the Valium p.r.n. for her back spasm and has     not been using Xanax.  She has been advised not to take both of     those medications together. 6. Disposition.  The patient is discharged home in stable condition.  FOLLOWUP RECOMMENDATIONS: 1. With Dr. Candyce Churn.  The patient is to call for anappointment. 2. With Dr. Armanda Magic.  The patient is to call for an appointment.  Time taken in coordinating this discharge is 35 minutes.     Marcellus Scott, MD     AH/MEDQ  D:  02/07/2011  T:  02/08/2011  Job:  [id number]  cc:   Candyce Churn, M.D. Armanda Magic, M.D.  Electronically Signed by Marcellus Scott MD on 02/14/2011 09:48:06 PM

## 2011-04-10 NOTE — Op Note (Signed)
NAMEFEVEN, ALDERFER              ACCOUNT NO.:  [id number]   MEDICAL RECORD NO.:  [id number]          PATIENT TYPE:  INP   LOCATION:  0006                         FACILITY:  [facility]   PHYSICIAN:  Erasmo Leventhal, M.D.DATE OF BIRTH:  08/26/1951   DATE OF PROCEDURE:  03/31/2008  DATE OF DISCHARGE:                               OPERATIVE REPORT   PREOPERATIVE DIAGNOSIS:  Left knee end-stage arthritis.   POSTOPERATIVE DIAGNOSIS:  Left knee end-stage arthritis.   PROCEDURE:  Left total knee arthroplasty.   SURGEON:  Erasmo Leventhal, M.D.   ASSISTANT:  Jaquelyn Bitter. Chabon, P.A.-C.   ANESTHESIA:  Spinal with monitored anesthesia care.   BLOOD LOSS:  Less than 100 mL.   DRAIN:  One medium Hemovac.   COMPLICATIONS:  None.   TOURNIQUET TIME:  One hour at 300 mmHg.   DISPOSITION:  PACU, stable.   OPERATIVE IMPLANTS:  Laural [employer].  Sigma.  Size 3 femur,  size 3 tibia, 12.5 mm posterior stabilized rotating platform tibial  insert, 35 mm patella.  All cemented.   OPERATIVE DETAILS:  The patient counseled in the holding area.  Taken to  the operating room where spinal anesthetic was administered.  Foley  catheter was placed utilizing sterile technique by the OR circulating  nurse.  All extremities well padded in bump.  Left knee was examined.  She had a 7 degrees flexion contracture.  She could flex to 110 degrees.  She is elevated, prepped with DuraPrep and draped in sterile fashion.  Exsanguinated with Esmarch, tourniquet inflated to 300 mmHg.  Prior to a  tourniquet inflation, 2 grams of IV Ancef had been given.  Straight  midline incision at the skin subcutaneous tissue.  Medial and lateral  soft tissue flaps developed at appropriate level.  Medial parapatellar  arthrotomy was performed and proximal and medial soft tissue release was  done due to her varus knee.  Knee was then flexed.  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  Cruciate ligaments were resected.  Starter hole made in distal femur,  canal was irrigated, effluent was clear.  Intramedullary rod was gently  placed.  I chose a 10 mm cut based upon the distal femur due to her  flexion contracture at a 5 degrees valgus alignment.  Distal femur found  be a size #3.  Rotational marks were made and distal femur was cut to  fit a size #3.  Medial and lateral menisci removed under direct  visualization.  Tibial eminence was resected, proximal tibia found be a  size 3.  Central aspect was identified, reamer, step reamer and  irrigation.  Intramedullary rod was gently placed and chose a 10 mm cut  off the lateral side which was the least deficient side.  This was done  at a 0 degree slope.  Posteromedial and posterior femoral osteophytes  removed under direct visualization.  At this time with flexion extension  blocks for 10 insert were well-balanced.   Tibial base plate was applied.  Rotation covered with set reamer and  punch  was performed.  The femoral box cut was now performed in standard  fashion.  At this time a size 3 femur, size 3 tibia with a 10 insert  with excellent range of motion and soft tissue balance and alignment.  Patella was found be a size 35, appropriate amount of bone was resected.  Locking holes were made and the patella button tracked anatomically.  All trials removed.  Knee was irrigated with pulsatile lavage.  Utilizing modern cement technique, all components were cemented in  place, size 3 tibia, size 3 femur, 35 patella with a 10 trial insert.  After cement had dried, all excess cement was removed.  Knee was again  irrigated, and with a 12.5 tibial insert we had excellent range of  motion, soft tissue balance and alignment.  Trial was removed.  Knee was  irrigated and a final 12.5 mm posterior stabilized rotating platform  tibial insert was implanted.  I now evaluated the knee, well-aligned,  well balanced,  had patellofemoral tracking anatomically, stable to varus  and valgus stress, 0 to 30, 60 and 90 degrees of flexion.  Elevated.  A  drain was placed and sequential closure in layers done.  The arthrotomy  was closed 90 degrees of flexion with Vicryl, subcu Vicryl, skin with  the subcu Monocryl suture.  Steri-Strips were applied.  Drain hooked to  suction drain.  Sterile dressing applied.  Tourniquet deflated.  Normal  circulation foot and ankle at the end of the case.  Placed into an ice  pack and knee immobilizer in full extension, awakened, taken to the  recovery room in stable condition.   Sponge and needle count correct.  No complications or problems.   To help with surgical decision making and technique, Mr. Leilani Able,  PA-C, was needed throughout the entire case.           ______________________________  Erasmo Leventhal, M.D.     RAC/MEDQ  D:  03/31/2008  T:  03/31/2008  Job:  [id number]

## 2011-04-10 NOTE — H&P (Signed)
NAMESHYNIA, DALEO              ACCOUNT NO.:  [id number]   MEDICAL RECORD NO.:  [id number]          PATIENT TYPE:  INP   LOCATION:                               FACILITY:  [facility]   PHYSICIAN:  Erasmo Leventhal, M.D.DATE OF BIRTH:  [birth year]   DATE OF ADMISSION:  DATE OF DISCHARGE:                              HISTORY & PHYSICAL   CHIEF COMPLAINT:  Left knee end-stage osteoarthritis.   PRESENT ILLNESS:  This is a 60 year old lady with a history of end-stage  osteoarthritis of her left knee with failure of conservative treatment  to alleviate her pain.  After discussion of treatments, benefits, risks,  and options, the patient is now scheduled for total knee arthroplasty of  her left total knee.  She has had medical clearance by Dr. Johnella Moloney  of [facility], and we will have the [facility] follow her  in the hospital with [place] for management of her diabetes and hypertension.   ALLERGIES:  SULFA DRUGS WHICH UPSET HER STOMACH, ENTEX WHICH RAISES HER  BLOOD PRESSURE, MORPHINE WHICH CAUSES ITCHING AND RASH, VASOTEC WHICH  CAUSES A HIGH PULSE RATE, ERYTHROMYCIN WITH UPSET STOMACH, VICODIN WITH  ITCHING AND FACIAL SWELLING, AND BETA BLOCKER WITH INTOLERANCE.   CURRENT MEDICATIONS:  1. Actos 45 mg 1/2 tablet q.a.m.  2. Glyburide 5 mg one q.p.m.  3. Omeprazole 20 mg one b.i.d.  4. Naproxen 500 mg one b.i.d.  5. Diltiazem 180 mg one q.a.m.  6. Micardis 80 mg 1/2 tablet q.a.m.  7. Hydrochlorothiazide 25 mg q.a.m.  8. Lipitor 20 mg one q.p.m.  9. Topamax 100 mg one b.i.d.  10.Venlafaxine 100 mg one at 1 a.m., one at noon, and one at 1 p.m.  11.Zyrtec 10 mg one q.a.m.  12.Calcium 600 mg one b.i.d.  13.Centrum Silver one q.a.m.  14.AZO Cranberry 900 mg one b.i.d.  15.Aspirin 81 mg one daily.  16.Fluticasone 50 mcg nasal spray p.r.n.  17.Glucosamine 2 q.a.m.   PAST SURGICAL HISTORY:  1. Tubal ligation.  2. Cholecystectomy.  3. Appendectomy.  4. HNP surgery on  the low back.  5. Bladder tack.  6. HNP surgery on cervical disk.  7. Bilateral carpal tunnel release.  8. Partial mastectomy on the right breast.  9. Full mastectomy on the right breast.  10.Reconstruction, right breast.  11.Breast reduction, left breast.  12.Breast implant, right breast.  13.Arthroscopy of the left knee.  14.Repeat lower back diskectomy.  15.ORIF of right wrist fracture and ulnar shortening of right wrist.   PAST MEDICAL HISTORY:  1. Diabetes.  2. Hypertension.  3. Depression.  4. Hyperlipidemia.   FAMILY HISTORY:  Positive for diabetes, hypertension, and cancer.   SOCIAL HISTORY:  The patient is married.  She is retired on disability.  She does not smoke and does not drink.   REVIEW OF SYSTEMS:  CENTRAL NERVOUS SYSTEM:  Positive for depression.  Negative for headache or blurred vision.  PULMONARY:  Negative for  shortness of breath, PND and orthopnea.  CARDIOVASCULAR:  Negative for  chest pain or palpitation.  GI:  Positive for reflux.  Negative for  ulcers or hepatitis.  GU:  Negative for urinary tract difficulty.  MUSCULOSKELETAL:  Positive in HPI.   PHYSICAL EXAMINATION:  VITAL SIGNS:  BP 126/76, respirations 16, pulse  72 and regular.  GENERAL:  This is a well-developed, well-nourished lady in no acute  distress.  HEENT:  Head normocephalic.  Nose patent.  Ears patent.  Pupils equal,  round, reactive to light.  Throat without injection.  NECK:  Supple without adenopathy.  Carotids 2+ without bruit.  CHEST:  Clear to auscultation.  No rales or rhonchi.  Respirations 16.  HEART:  Regular rate and rhythm at 72 beats a minute without murmur.  ABDOMEN:  Soft with active bowel sounds.  No masses, organomegaly.  NEUROLOGIC:  Patient alert and oriented to time, place and person.  Cranial nerves II-XII grossly intact.  EXTREMITIES:  Left knee with a varus deformity, 5-degree flexion  contracture with further flexion to 135 degrees.  Neurovascular status   intact with dorsalis pedis and posterior tibialis pulses 1+.   X-ray shows end-stage osteoarthritis, left knee.   IMPRESSION:  End-stage osteoarthritis left knee.   PLAN:  Total knee arthroplasty, left knee.      Jaquelyn Bitter. Chabon, P.A.    ______________________________  Erasmo Leventhal, M.D.    SJC/MEDQ  D:  03/15/2008  T:  03/15/2008  Job:  [id number]

## 2011-04-13 NOTE — Op Note (Signed)
. [facility]  Patient:    [name], [name]                       MRN: [id number] Proc. Date: 01/10/01 Adm. Date:  01/10/01 Attending:  Payton Doughty, M.D.                           Operative Report  PREOPERATIVE DIAGNOSIS:  Herniated disk on the right side L4-5.  POSTOPERATIVE DIAGNOSIS:  Herniated disk on the right side L4-5.  OPERATIVE PROCEDURE:  L4-5 laminectomy and diskectomy.  SURGEON:  Payton Doughty, M.D.  ANESTHESIA:  General endotracheal.  PREP:  Shave and prepped, scrubbed with alcohol wipe.  COMPLICATIONS:  None.  DESCRIPTION OF PROCEDURE:  A 60 year old right-handed, white girl with a right L5 radiculopathy and herniated disk at L4-5 on the right.  Taken to the operating room, smoothly anesthetized, intubated and placed prone on the operating table.  Following shave, prepped and draped in usual sterile fashion.  Skin was infiltrated with 1% lidocaine with 1:400,000 epinephrine. The top one-half of her old skin incision was reopened, exposing the L4 and L5 laminae on the right side.  Intraoperative x-ray was made with marker in place to confirm correctness level.  Hemisemilaminectomy of L4 was then carried out to the top of ligamentum flavum, which was removed in a retrograde fashion. This was done with a Kerrison.  Having removed the ligamentum flavum, the right L5 root was identified and dissected free and retracted medially. Immediately obvious was a herniated disk compressing the right L5 root.  There were few remaining annular fibers and these were divided and the disk removed without difficulty. The disk space was then carefully explored and graspable fragments were removed.  The end plate was gently curetted.  Having completed curetting and grasping all marginally clinging fragments, the nerve was explored and found to be free.  The wound was irrigated.  Hemostasis assured. Depo Medrol soaked fat was packed over the nerve and the  fascia was reapproximated with 0 Vicryl in an interrupted fashion, subcutaneous tissues were reapproximated with 0 Vicryl in an interrupted fashion, the subcuticular tissues were reapproximated with 3-0 Vicryl in an interrupted fashion.  The skin was closed with 3-0 nylon in a running lock fashion.  Betadine, Telfa dressing was applied and made occlusive with OpSite.  The patient returned to the recovery room in good condition. DD:  01/10/01 TD:  01/10/01 Job: [id number]

## 2011-04-13 NOTE — Op Note (Signed)
[name], [name]              ACCOUNT NO.:  [id number]   MEDICAL RECORD NO.:  [id number]          PATIENT TYPE:  AMB   LOCATION:  DSC                          FACILITY:  [facility]   PHYSICIAN:  Katy Fitch. 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   POSTOPERATIVE DIAGNOSIS:  Chronic post-traumatic ulnocarpal abutment of  right wrist due to severely comminuted intra-articular fracture of right  distal radius treated with autogenous bone grafting and external fixation in  [place], [place] in late summer 2005 with subsequent development of bone  graft resorption, significant shortening malunion of the radius, and painful  ulnocarpal abutment documented radiographically as well as clinically with  identification of profound internal derangement of lunate facette of distal  radius with extensive granulation tissue and scar tissue replacing distal  radial end plate.   OPERATION:  1.  A 5 mm precision ulnar shortening of right forearm with application of      five hole ASIF dynamic compression plate.  2.  Diagnostic and therapeutic arthroscopy of right wrist with      identification of intact scaphoid facette of distal radius and severely      drains lunate facette of distal radius with extensive granulation tissue      and scar substitution of joint surface.   OPERATING SURGEON:  Katy Fitch. Sypher, M.D.   ASSISTANT:  Annye Rusk,  PA-C.   ANESTHESIA:  General endotracheal.   SUPERVISING ANESTHESIOLOGIST:  Janetta Hora. Gelene Mink, M.D.   INDICATIONS:  [name] is a 60 year old right-hand dominant woman with  a history of type 2 diabetes.  She has a history of falling onto her right  wrist when visiting the [place] region of [place] in the  late summer of 2005. She sustained a severely comminuted intra-articular  fracture of her right distal radius.  She was initially evaluated locally in  [place], subsequently transferred to [place], [place] for  orthopedic evaluation and due to the complexity of her injury was then  transferred to [facility] in [place], [place].   There she was evaluated by an attending orthopedic hand surgeon who elected  to proceed with attempted reconstruction of the joint surface with  autogenous iliac graft and application of the Agee wrist jack fixation  device.  Her postoperative course was marked by some minor pin tract  concerns and loosening of the interfragmentary Kirschner wire fixation  requiring removal.  Serial x-rays revealed complete resorption of her bone  graft probably due to the fact that it was placed under distraction rather  than compression.   Having previously  been involved attempts at the radial lengthening following  fracture and seeing iliac grafts evaporate, we were then faced, while  providing her aftercare with a gradual subsidence of her radius.  As she  maintained a relatively pain free motion at the radiocarpal articulation, we  discussed at length a way of relieving her ulnocarpal abutment symptoms that  evolved.  At the present time she is approximately 5 mm ulnar plus and has  radial deviation of the wrist and pain with any attempted supination or  ulnar deviation.   We recommended proceeding with ulnar shortening at this time with assessment  of the radiocarpal articulation and possible need for second stage  procedure  that would involve a radiocarpal fusion or scapholunate radial fusion.  After informed consent, [name] was brought to the operating room at  this time.  Given her background history of diabetes, she has been advised  that she may have a challenge healing her shortening osteoplasty although  typically with good compression plating this has not been a problem in the  past.  After informed consent, she is brought to the operating room at this time.   PROCEDURE:  [name] was brought to operating room and placed supine  position on the table.  Following the induction general anesthesia, the  right arm was prepped with Betadine soap and solution, sterilely draped. 1  gram of Ancef was administered as IV prophylactic antibiotic.   Procedure commenced with exsanguination of the right arm with an Esmarch  bandage and inflation of arterial tourniquet to 220 mmHg. Procedure  commenced with a 10 cm incision along the subcutaneous border of the ulna  extending towards the ulnar head. The subcutaneous tissues were carefully  divided, taking care to identify and gently retract the ulnar dorsal sensory  branch distally.   The interval between the extensor carpal ulnaris and the brachial fascia was  incised exposing the periosteum of the distal ulna. The extensor carpi  ulnaris and a portion of the extensor indicis proprius were elevated  exposing the periosteum of the distal ulna.  A five hole plate was selected  and placed with the aid of a C-arm fluoroscope in the proper position on the  ulna. The two distal screws were then placed and prepared  for 14-mm screws  followed by creation of a 5 mm resection osteoplasty placed on a 45 degree  angle to allow compression plating.   The 5-mm segment of ulnar shaft was removed taking care not to disrupt the  interosseous membrane followed by application of the plate with a Verbrugge bone clamp compressing the osteoplasty site  anatomically and confirming  proper position of the plate and osteoplasty with C-arm fluoroscopy.   The proximal two holes were placed with compression eccentric placement very  adequately compressing the osteoplasty followed by placement of a lag screw  across the osteoplasty site at a 90 degrees angle.  The ulna was leveled to  the ulnar aspect of the lunate facette.   There was an area of impaction and settling within the articular surface of  the lunate that will be assessed arthroscopically at the completion of the  osteoplasty.  The wound was then irrigated and then the skin repaired with  subdermal sutures of 3-0 Vicryl and intradermal through Prolene with Steri-  Strips.  The wrist was then distracted with finger traps on the index and  long fingers in a tower designed for wrist arthroscopy.  Countertraction was  applied the forearm  with a well-padded Velcro strap.   The wrist joint was sounded with a 18 gauge needle followed by placement of  the arthroscope through a 3-4 portal with blunt technique.  The hyaline  articular cartilage surfaces of the scaphoid and adjacent scaphoid facette  were well preserved. The radial half of the lunate had intact hyaline  articular cartilage surfaces and the ulnar half was obliterated by  granulation tissue and other scar from the lunate facette fracture. There  were considerable granulation tissues present obscuring the anatomy of  lunate facette of the distal radius.   A 6-R portal was created with an 18 gauge needle and a suction shaver used  to debride granulation tissue and scar to allow visualization of the lunate  facette.  Considerable scarring obliterated the anatomy the triangular  fibrocartilage and the lunate facette.  After a limited debridement, in my judgment I could not identify any utility  in completely debriding the scar and rather simply contoured the remaining  scar to try to create a cup type arthroplasty for the lunate  and triquetrum.   Photographic documentation of the lunate facette pathology was accomplished  with a digital camera followed by removal of the arthroscopic equipment.  Our surgery was successful in leveling the radius and ulna; however, given  the findings in the lunate facette we may be challenged with persistent  wrist pain and may need to consider proceeding with arthrodesis of the  radiocarpal articulation at some point in the future.   There were no apparent complications with the surgery and anesthesia. Ms.  Davis was awakened from anesthesia and transferred to recovery room with  stable vital signs. We will anticipate discharge home with prescriptions for  Keflex 500 milligrams one by mouth every 8 hours times 4 days as  prophylactic antibiotic and Dilaudid 2 milligrams one by mouth every four to  six hours p.r.n. pain as well as Motrin 600 milligrams one by mouth every  six hours as needed for pain 30 tablets without refill.      RVS/MEDQ  D:  01/23/2005  T:  01/23/2005  Job:  [id number]

## 2011-04-13 NOTE — H&P (Signed)
[place]  Patient:    [name], [name]                     MRN: [id number] Adm. Date:  [id number] Disc. Date: [id number] Attending:  Emeterio Reeve                         History and Physical  ADMISSION DIAGNOSIS:  Recurrent herniated disk on the right.  HISTORY OF PRESENT ILLNESS:  This is a 60 year old right-handed white female who had a 4-5 disk done numerous years ago on the right side.  She has also had a left-sided S1 disk done and anterior cervical diskectomy done in the past. She had been doing well and then had an increase in pain in her right leg and an MRI shows a recurrent herniated disk at 4-5 on the right and she is admitted for a diskectomy.  PAST MEDICAL HISTORY:  Remarkable for depression.  PAST SURGICAL HISTORY:  As noted above, plus cholecystectomy, appendectomy, and bladder tack.  MEDICATIONS: 1. Hydrochlorothiazide. 2. Wellbutrin. 3. Hytrin. 4. Toprol.  ALLERGIES:  SULFA.  SOCIAL HISTORY:  She does not smoke and does not drink.  FAMILY HISTORY:  Noncontributory for significant diseases.  REVIEW OF SYSTEMS:  Remarkable for back pain, leg pain, and depression.  PHYSICAL EXAMINATION:  HEENT:  Within normal limits.  She has good range of motion of her neck.  CHEST:  Clear.  HEART:  Regular rate and rhythm.  ABDOMEN:  Nontender with no hepatosplenomegaly.  EXTREMITIES:  Without clubbing or cyanosis.  GENITOURINARY:  Deferred.  Peripheral pulses are good.  NEUROLOGICAL:  She is awake, alert, and oriented.  Cranial nerves II-XII intact.  Motor examination shows 5/5 strength throughout the upper and lower extremities.  Straight leg raise is positive on the right.  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  PLAN:  Redo diskectomy.  The risks and benefits of this approach have been discussed and she wishes to proceed. DD:  01/10/01 TD:  01/10/01 Job: [id number]

## 2011-04-13 NOTE — Op Note (Signed)
NAME:  [name], [name]                        ACCOUNT NO.:  [id number]   MEDICAL RECORD NO.:  [id number]                   PATIENT TYPE:  EMS   LOCATION:  MAJO                                 FACILITY:  [facility]   PHYSICIAN:  Dionne Ano. Everlene Other, M.D.         DATE OF BIRTH:  Aug 31, 1951   DATE OF PROCEDURE:  DATE OF DISCHARGE:  08/12/2004                                 OPERATIVE REPORT   [name] presents to the [facility] Emergency Room Saturday, August 12, 2004.  I discussed with [name], her upper extremity predicament on  the phone last night, August 11, 2004.  She was seen by Dr. Teressa Senter,  August 11, 2004, at his office in regard to her upper extremity  predicament.  She, of course, fell in [place], [place], underwent surgery  with external fixation utilizing an Agee wrist jack, dorsal bone grafting  and pin fixation x1 about the ulnar aspect of the wrist.  Subsequent to  that, she traveled back home and Dr. Teressa Senter has assumed her care.  Visit 24  hours ago in Dr. Stark Jock office, the pin had migrated out.  Dr. Teressa Senter  clipped this and capped it according to her report but did not push it back  in.  The patient states the pin is loose. She is still having pain  complaints as well.  She has recently been started on antibiotic due to  superficial pin infection and notes no complicating features with this.  She  and I have discussed these issues at length.  She denies numbness or  tingling in her hands.   PAST SURGICAL HISTORY:  1.  Tubal ligation.  2.  Gallbladder removal.  3.  Appendectomy.  4.  Ruptured disc in her lower back.  5.  Bladder tack.  6.  Ruptured cervical disc.  7.  Carpal tunnel release on the right and left sides.  8.  History of partial mastectomy and reconstructive surgery thereafter.  9.  History of nipple reconstruction.  10. Left knee arthroscopy.   CURRENT MEDICATIONS:  Effexor, Allegra, Lipitor, Prilosec, Micardis,  hydrochlorothiazide, Diltiazem, glyburide, Actos, Centrum Silver, aspirin,  Prevacid and Caltrate.   ALLERGIES:  1.  VICODIN which causes itching and facial swelling.  2.  BETA-BLOCKER intolerance in the past.  3.  ERYTHROMYCIN.  4.  VASOTEC.  5.  MORPHINE.  6.  ENTEX.  7.  SULFA DRUGS.   PHYSICAL EXAMINATION:  GENERAL APPEARANCE:  The patient is alert and  oriented, in no acute distress.  VITAL SIGNS:  Stable.  She is afebrile.  EXTREMITIES:  The patient has an upper extremity that is examined at length.  The right upper extremity has a normal sensation about the fingertips and  thumb, flexion, extension of the thumb and fingertips are intact.  She has  fixator which is a standard Agee wrist fixator.  She does have motion at the  wrist joint and this is  due the bearing on the fixator being loose.  I have  no idea if this is per her doctor's instructions or not but she has not  noticed it until the last day or so she notes.  I should note she has a  loose pin with honey crusted discharge about the ulnar aspect of her wrist.  The pin sites for the fixator proximally look excellent. The pin sites  distally have a slight rim of erythema, however, this is quite minimal and  there is no active discharge.  I have reviewed this at length.  I should  note she has normal vascular examination and a nontender elbow.   X-rays were reviewed which show a comminuted, complex intra-articular distal  radius fracture with a large amount of dorsal bone loss and dorsal collapse.  Certainly the pin has migrated substantially and does not have any  meaningful purchase in my opinion.  I have discussed these issues with the  patient at length.  Unfortunately, this pin is simply not holding anything  in terms of fixation in a meaningful fashion in my opinion.  I have noted  that there is a fair degree of dorsal angulatory collapse due to metaphyseal  comminution as well, although she did have a bone graft  performed at the  time of her index procedure in [place].   IMPRESSION/PLAN:  This patient has a wrist fracture less than two weeks out.  She was treated with bone grafting and pin fixation and external fixation.  The pin that she has now about the ulnar aspect of the wrist is completely  loose, honey crusted and has a small rim of erythema.  I have removed this  today and there was absolutely no resistance to taking it out indicative of  a high loose pin.  Following this, I cleansed the area with Betadine.  I  cleaned all of her pin sites and discussed with her hydrogen peroxide and  saline half and half pin site care as well as dressed the pin sites.  There  was no substantial deep infection.  I have tightened her external fixator  and I have tightened and secured the motion at the wrist joint.  Due to the  dorsal angulatory collapse, it is my impression that she would likely be  best served without any motion in this region for now.  I will, of course,  defer to Dr. Teressa Senter who is treating physician but for now, I have locked her  down so that there will be no more motion in the wrist joint.  If he decides  to institute the motion he can do so in three to four days when he sees her  back in the office per the regularly scheduled appointment.   I have discussed with her do's and don'ts, etc. If there are worsening  problems over the weekend, she will let me know of course. We also placed an  ulna gutter splint (OCL splint) on the ulnar aspect of her arm for comfort  to prevent excessive skin movement and distal ulna movement given the  comminuted nature and pain she has here.  She tolerated this well.  I  discussed these issues in great detail and all questions have been  encouraged and answered.      WMG/MEDQ  D:  08/12/2004  T:  08/14/2004  Job:  [id number]   cc:   Katy Fitch. Naaman Plummer., M.D.  [address]  [place]  [place] [address]  Fax: [phone]

## 2011-04-13 NOTE — Discharge Summary (Signed)
[name], [name]              ACCOUNT NO.:  [id number]   MEDICAL RECORD NO.:  [id number]          PATIENT TYPE:  INP   LOCATION:  1609                         FACILITY:  [facility]   PHYSICIAN:  Erasmo Leventhal, M.D.DATE OF BIRTH:  [birth year]   DATE OF ADMISSION:  03/31/2008  DATE OF DISCHARGE:  04/04/2008                               DISCHARGE SUMMARY   ADMITTING DIAGNOSIS:  End-stage osteoarthritis, left knee.   DISCHARGE DIAGNOSIS:  End-stage osteoarthritis, left knee.   OPERATION:  Total knee arthroplasty, left knee.   BRIEF HISTORY:  This is a 60 year old lady with a history of end-stage  osteoarthritis of her left knee with failure of conservative treatment  to alleviate her pain.  After discussion of treatment benefits, risks  and options, the patient now scheduled for total knee arthroplasty.  The  patient will be followed by Dr. Johnella Moloney of [facility] to  help manage her diabetes and hypertension.   LABORATORY VALUES:  Admission CBC within normal limits.  Hemoglobin and  hematocrit reached a low of 10.8 and 31.5 on the 8th and were back up to  10.9 and 32.3 on the 9th.  PT/PTT within normal limits.  Admission CMET  showed the glucose high at 122.  It ran mildly elevated in the 122 to  177 range throughout admission.  Urinalysis normal other than a hazy  color.   COURSE IN THE HOSPITAL:  The patient tolerated the operative procedure  well.  [facility] were consulted to manage her diabetes and  hypertension postoperatively.  The first postoperative day, she was in  moderate pain.  Vital signs stable, afebrile.  O2 was 97 on 2 liters.  I&O was good.  Drained 455 out.  Hemoglobin and hematocrit were  acceptable.  Lungs clear.  Heart sounds normal.  Bowel sounds sluggish.  Dressing was dry.  Drain was removed.  She was started in therapy.  The  second postoperative day, she was feeling better, vital signs stable.  Temperature max 99.0, 02 was 97  on 2 liters.  Hemoglobin 10.8,  hematocrit 31.5.  Lungs clear.  Heart sounds normal.  Bowel sounds  sluggish.  Calves negative.  Wound benign.  Dressing was changed.  IV  was changed to a hep-well.  Plans were made for discharge home Sunday.  The third postoperative day, she was doing well.  Vital signs stable.  Afebrile.  Neurovascular status intact.  Calves were negative.  Wound  was benign.  Lungs were clear.  She was ambulating well with therapy,  and plans were made for discharge the next morning, and on the fourth  postoperative day with vital signs stable, incision dry, calves  negative, wound benign, the patient was subsequently discharged home for  followup in the office.   CONDITION ON DISCHARGE:  Improved.   DISCHARGE MEDICATIONS:  Percocet 5/325 one to two q.6 h p.r.n. pain,  Robaxin 500 one p.o. daily p.r.n. spasm, iron 325 mg 1 pill twice a day  for anemia, Lovenox 1 shot each day, 30 mg at 7:00 a.m. and 7:00 p.m.,  and when Lovenox finished to  take one 81-mg aspirin a day for 3 weeks.   DISCHARGE INSTRUCTIONS:  She is to keep the wound clean and dry.  She is  to eat a regular diet.  She is to use her walker and walk with  assistance, call the office for recheck in 2 weeks or call sooner p.r.n.  problems.      Jaquelyn Bitter. Chabon, P.A.    ______________________________  Erasmo Leventhal, M.D.    SJC/MEDQ  D:  05/11/2008  T:  05/11/2008  Job:  [id number]

## 2011-04-13 NOTE — Op Note (Signed)
[name], [name]              ACCOUNT NO.:  [id number]   MEDICAL RECORD NO.:  [id number]          PATIENT TYPE:  AMB   LOCATION:  DSC                          FACILITY:  [facility]   PHYSICIAN:  Katy Fitch. Sypher Montez Hageman., M.D.DATE OF BIRTH:  1951-04-28   DATE OF PROCEDURE:  09/21/2004  DATE OF DISCHARGE:                                 OPERATIVE REPORT   PREOPERATIVE DIAGNOSIS:  Status post severely comminuted intra-articular  fracture, right distal radius, sustained in [place], ultimately  treated at [facility] in [place], [place], with open reduction  and internal fixation and allograft bone graft, with application of an Agee  Wrist Ree Kida external fixation device.   POSTOPERATIVE DIAGNOSIS:  Status post severely comminuted intra-articular  fracture, right distal radius, sustained in [place], ultimately  treated at [facility] in [place], [place], with open reduction  and internal fixation and allograft bone graft, with application of an Agee  Wrist Ree Kida external fixation device.   OPERATION:  Removal of Agee Wrist Jack external fixation device, followed by  curettage of pin tracks x4 and application of volar plaster splint dressing.   OPERATING SURGEON:  Katy Fitch. Sypher, M.D.   ASSISTANT:  Jonni Sanger, P.A.   ANESTHESIA:  General by LMA, supervising anesthesiologist is Janetta Hora.  Gelene Mink, M.D.   INDICATIONS:  [name] is a 60 year old woman who sustained a complex  injury to her right wrist [place] [address] [place]   She slipped while getting into a boat, falling full weightbearing onto her  right hand.   She sustained a very complex intra-articular fracture that was initially  evaluated at a local hospital in [place], [place].  She was then  advised to seek medical care at [facility], [place], which is a sizeable  community.  She subsequently was sent  to [facility], which is a  major tertiary canter, for evaluation and management and underwent  reconstructive surgery by Dr. Alexander Mt with ORIF of her comminuted intra-  articular fracture utilizing a bone graft for support of articular surfaces  placed dorsally.   She subsequently returned to [place], [place], and as she was an  established patient with our practice sought follow-up care.  Dr.  Alexander Mt  forwarded limited medical records but did have a lengthy conversation with  me on the phone regarding her fracture predicament.   Upon initial evaluation, she was noted to have a near-anatomic  reconstruction.  She had had some displacement of her bone graft and had had  a Kirschner wire placed from ulnar to radial supporting the lunate facet.   She was instructed in appropriate pin care and was followed clinically.  She  maintained full range of motion of her fingers and thumb but did have some  difficulties with an interfocal pin becoming loose and backing out  approximately one month following her surgery.   She initially saw me for this problem and had the pin shortened and later  saw Dr. Amanda Pea on call  at [facility] emergency room.   During the eight weeks of her mobilization in the fixator, she has had a  gradual subsidence of her lunate facet.  She is now approximately 1.5 mm  ulnar-positive.  However, given the complex nature of her injury, this is  not an unacceptable outcome.   We have advised her to have the fixator removed at this time, as we have  identified bridging callus on multiple x-rays obtained within the past week.  Our plan is to mobilize her and consider ulnar shortening at a later date  should she have signs of abutment.   After informed consent, she is brought to the operating room at this time  anticipating fixator removal and pin track curettage.   PROCEDURE:  [name] was brought to the operating room and placed in  the supine  position upon the operating table.   Following the induction of general anesthesia by LMA, the right arm was  prepped with Betadine soap and solution and sterilely draped.  No tourniquet  was utilized.  The procedure commenced with removal of the Agee Wrist Ree Kida.  The pin tracks were cleaned with Betadine.  Subsequently a Jacobs chuck was  used to remove the four pins from the metacarpals and radius with hand  turning of the [employer].   The pin tracks were then meticulously curetted with a microcurette and  irrigated thoroughly.   The wounds were then dressed with Xeroflo, sterile gauze, sterile Webril,  and a volar plaster splint maintaining the wrist in 20 degrees of  dorsiflexion.   We will encourage Ms. Dobosz to begin immediate range of motion exercises,  emphasizing pronation, supination, and finger and thumb motion.   She will return to our office in approximately five days for splint removal  and advancement to a gradual recovery of range of motion.   With gentle motion of her wrist in the OR, she was noted to have passive  dorsiflexion of 25 degrees and passive palmar flexion of 15 degrees.  There  will likely be a significant tenodesis of her finger extensor and possibly  wrist extensors at the site of her dorsal bone graft.      Robe   RVS/MEDQ  D:  09/21/2004  T:  09/21/2004  Job:  [id number]

## 2011-06-20 ENCOUNTER — Ambulatory Visit (HOSPITAL_COMMUNITY): Payer: Medicare Other | Admitting: Psychiatry

## 2011-08-21 LAB — PROTIME-INR: Prothrombin Time: 13.8

## 2011-08-21 LAB — URINALYSIS, ROUTINE W REFLEX MICROSCOPIC
Glucose, UA: NEGATIVE
Hgb urine dipstick: NEGATIVE
Protein, ur: NEGATIVE
Specific Gravity, Urine: 1.019

## 2011-08-21 LAB — DIFFERENTIAL
Basophils Relative: 1
Eosinophils Absolute: 0.6
Eosinophils Relative: 8 — ABNORMAL HIGH
Monocytes Absolute: 0.6
Monocytes Relative: 8
Neutro Abs: 4

## 2011-08-21 LAB — COMPREHENSIVE METABOLIC PANEL
ALT: 36 — ABNORMAL HIGH
AST: 24
Albumin: 4
Alkaline Phosphatase: 58
Potassium: 4.5
Sodium: 142
Total Protein: 6.7

## 2011-08-21 LAB — CBC
Platelets: 238
RDW: 13.8
WBC: 7.4

## 2012-05-14 ENCOUNTER — Other Ambulatory Visit: Payer: Self-pay | Admitting: Internal Medicine

## 2012-05-14 DIAGNOSIS — R109 Unspecified abdominal pain: Secondary | ICD-10-CM

## 2012-05-16 ENCOUNTER — Ambulatory Visit
Admission: RE | Admit: 2012-05-16 | Discharge: 2012-05-16 | Disposition: A | Discharge status: Home / Self Care | Payer: Medicare Other | Source: Ambulatory Visit | Attending: Internal Medicine | Admitting: Internal Medicine

## 2012-05-16 DIAGNOSIS — R109 Unspecified abdominal pain: Secondary | ICD-10-CM

## 2012-05-16 MED ORDER — IOHEXOL 300 MG/ML  SOLN
125.0000 mL | Freq: Once | INTRAMUSCULAR | Status: AC | PRN
  Administered 2012-05-16: 125 mL via INTRAVENOUS

## 2012-05-26 ENCOUNTER — Encounter (INDEPENDENT_AMBULATORY_CARE_PROVIDER_SITE_OTHER): Payer: Self-pay | Admitting: Surgery

## 2012-05-26 DIAGNOSIS — N200 Calculus of kidney: Secondary | ICD-10-CM

## 2012-05-26 HISTORY — DX: Calculus of kidney: N20.0

## 2012-06-02 ENCOUNTER — Encounter (INDEPENDENT_AMBULATORY_CARE_PROVIDER_SITE_OTHER): Payer: Self-pay | Admitting: Surgery

## 2012-06-02 ENCOUNTER — Ambulatory Visit (INDEPENDENT_AMBULATORY_CARE_PROVIDER_SITE_OTHER): Payer: Medicare Other | Admitting: Surgery

## 2012-06-02 VITALS — BP 150/76 | HR 88 | Temp 97.6°F | Resp 20 | Ht 64.0 in | Wt 207.4 lb

## 2012-06-02 DIAGNOSIS — K648 Other hemorrhoids: Secondary | ICD-10-CM

## 2012-06-02 DIAGNOSIS — K644 Residual hemorrhoidal skin tags: Secondary | ICD-10-CM

## 2012-06-02 MED ORDER — NYSTATIN 100000 UNIT/GM EX POWD: CUTANEOUS | Status: AC

## 2012-06-02 MED ORDER — HYDROCORTISONE ACE-PRAMOXINE 2.5-1 % RE CREA: TOPICAL_CREAM | Freq: Three times a day (TID) | RECTAL | Status: DC

## 2012-06-02 NOTE — Progress Notes (Signed)
General Surgery [facility]  Chief Complaint  Patient presents with  . Rectal Problems    chronic rectal pain and hems    HISTORY: The patient is a 61 year old white female well known to my surgical practice. She has been treated in our practice for gallbladder disease, breast cancer, and hemorrhoidal disease. Her last visit was in March of 2009.  Patient continues to complain of anal pain. She has difficulty cleansing herself after bowel movements. She does not have incontinence. She has multiple anal skin tags. She complains of skin irritation. She complains of prolapse. She has not had any significant bleeding.  Patient has had no prior anorectal surgery. Surgery was planned in 2009, but due to knee surgery, the patient did not undergo anal rectal procedures.  Past Medical History  Diagnosis Date  . Hypertension   . Hyperlipidemia   . Diabetes mellitus   . GERD (gastroesophageal reflux disease)   . Depression   . Arthritis   . Cancer   . Eczema      Current Outpatient Prescriptions  Medication Sig Dispense Refill  . ALPRAZolam (XANAX) 1 MG tablet Take 1 mg by mouth at bedtime as needed. One half tablet      . aspirin 81 MG tablet Take 81 mg by mouth daily.      Marland Kitchen buPROPion (WELLBUTRIN SR) 100 MG 12 hr tablet Take 100 mg by mouth daily.      . cetirizine (ZYRTEC) 10 MG tablet Take 10 mg by mouth daily.      . Cholecalciferol (VITAMIN D3) 1000 UNITS CAPS Take by mouth daily.      . cyclobenzaprine (FLEXERIL) 10 MG tablet Take 10 mg by mouth 3 (three) times daily as needed.      . diltiazem (DILACOR XR) 180 MG 24 hr capsule Take 180 mg by mouth daily.      . furosemide (LASIX) 20 MG tablet Take 20 mg by mouth daily.      . GLYBURIDE PO Take 10 mg by mouth daily.      . insulin glargine (LANTUS) 100 UNIT/ML injection Inject 10-40 Units into the skin at bedtime.      Marland Kitchen ketorolac (TORADOL) 10 MG tablet Take 10 mg by mouth every 6 (six) hours as needed.        . meperidine (DEMEROL) 50 MG tablet Take 50 mg by mouth every 4 (four) hours as needed.      Marland Kitchen omeprazole (PRILOSEC) 20 MG capsule Take 20 mg by mouth daily.      Marland Kitchen telmisartan (MICARDIS) 80 MG tablet Take 80 mg by mouth daily.      . traMADol-acetaminophen (ULTRACET) 37.5-325 MG per tablet Take 1 tablet by mouth every 6 (six) hours as needed.      . venlafaxine (EFFEXOR) 100 MG tablet Take 100 mg by mouth 2 (two) times daily.      . hydrocortisone-pramoxine (ANALPRAM-HC) 2.5-1 % rectal cream Place rectally 3 (three) times daily. Apply to anus and skin tags three times daily as needed for itching and burning and swelling.  30 g  0  . nystatin (MYCOSTATIN) powder Apply to affected area 3 times daily  60 g  3     Allergies  Allergen Reactions  . Dilaudid (Hydromorphone Hcl) Itching  . Entex Lq (Phenylephrine-Guaifenesin) Hypertension  . Lipitor (Atorvastatin) Other (See Comments)    Leg pain  . Percocet (Oxycodone-Acetaminophen) Itching  . Protonix (Pantoprazole Sodium)   . Sulfa Antibiotics Nausea Only  .  Morphine And Related Rash  . Vasotec (Enalapril) Palpitations  . Vicodin (Hydrocodone-Acetaminophen) Swelling and Rash     Family History  Problem Relation Age of Onset  . Diabetes Mother   . Hypertension Mother   . Cancer Father     liver     History   Social History  . Marital Status: Married    Spouse Name: N/A    Number of Children: N/A  . Years of Education: N/A   Social History Main Topics  . Smoking status: Never Smoker   . Smokeless tobacco: None  . Alcohol Use: No  . Drug Use: No  . Sexually Active:    Other Topics Concern  . None   Social History Narrative  . None     REVIEW OF SYSTEMS - PERTINENT POSITIVES ONLY: No significant bleeding. Persistent burning and itching. Difficulty cleansing after bowel movements. Rash on the perineum.  EXAM: Filed Vitals:   06/02/12 0858  BP: 150/76  Pulse: 88  Temp: 97.6 F (36.4 C)  Resp: 20     HEENT: normocephalic; pupils equal and reactive; sclerae clear; dentition good; mucous membranes moist NECK:  symmetric on extension; no palpable anterior or posterior cervical lymphadenopathy; no supraclavicular masses; no tenderness CHEST: clear to auscultation bilaterally without rales, rhonchi, or wheezes CARDIAC: regular rate and rhythm without significant murmur; peripheral pulses are full GU:  External examination shows a significant yeast infection with skin breakdown. There are multiple large anal skin tags circumferentially which are tender and edematous. There is no bleeding. Digital rectal exam shows edema in the anal canal. Again there is no bleeding. Anoscopy is performed. 360 view shows grade 2 internal hemorrhoids. There is an element of prolapse due to edema. There is moderate discomfort. There is no sign of fissure. There is no sign of fistula. EXT:  non-tender without edema; no deformity NEURO: no gross focal deficits; no sign of tremor   LABORATORY RESULTS: See [facility] (CHL-Epic) for most recent results   RADIOLOGY RESULTS: See [facility] (CHL-Epic) for most recent results   IMPRESSION: #1 grade 2-3 internal hemorrhoids with intermittent prolapse #2 prominent anal skin tags with inflammatory changes #3 yeast infection of perineum with skin breakdown  PLAN: Patient was started on topical Mycostatin powder 3 times daily. I have also given her a prescription for Analpram to use on the anal skin tags as needed for burning and itching.  I have given the patient information on the [facility] procedure. I have asked her to return to see our new colorectal specialist in approximately 4-6 weeks for evaluation.  Patient will use tub soaks as needed. She will use baby wipes or Tucks pads. She will use topical creams as prescribed.  Velora Heckler, MD, FACS General & Endocrine Surgery [facility]   Visit Diagnoses: 1. Hemorrhoidal skin tags,  multiple   2. Prolapsed internal hemorrhoids     Primary Care Physician: Pearla Dubonnet, MD

## 2012-06-02 NOTE — Patient Instructions (Signed)
ANORECTAL PROCEDURES: 1.  Tub soaks 2-3 times daily in warm water (may add Epsom salts if desired) 2.  Stool softener for one month (store brand Miralax or Colace) 3.  Avoid toilet paper - use baby wipes or Tucks pads 4.  Increase water intake - 6-8 glasses daily 5.  Apply dry pad to area until drainage stops 

## 2012-06-09 ENCOUNTER — Other Ambulatory Visit: Payer: Self-pay | Admitting: Urology

## 2012-06-10 ENCOUNTER — Encounter (HOSPITAL_COMMUNITY): Payer: Self-pay | Admitting: Pharmacy Technician

## 2012-06-19 ENCOUNTER — Encounter (HOSPITAL_COMMUNITY): Payer: Self-pay | Admitting: *Deleted

## 2012-06-20 NOTE — H&P (Signed)
History of Present Illness                 F/u ureteral stone referred by Dr. Marden Noble June 2013. She developed gradual, intermittent left LQ pain. She was treated with two rounds of abx and did not improve. She has had no dysuria or hematuria. No emesis but Nausea. No fevers or chills. She has not seen the stone pass. She still has some occasional LLQ pain. She's been straining.  A KUB was performed May 14, 2012 and read as normal but on hindsite there may be a calcification at the edge of the left sacrum. A CT abdomen and pelvis with IV and p.o. contrast scan was performed May 16, 2012 and revealed a 6 mm left distal ureteral stone with mild proximal hydroureteronephrosis and delayed enhancement of the left kidney. There were no other stones. I reviewed all the above images.    She has never had kidney stones before. She saw Dr. Patsi Sears who performed a cystocele repair about 20 yrs ago.   She has no frequency. She has no urgency. She voids with a good stream. She has rare leakage with cough, sneeze or with urgency. She wears a pad everyday but it is usually dry.   Labs June 2013: BUN 11, creatinine 1.25, UA negative.   Interval Hx She returns and has no passed the stone. She has occasional intermittent left flank pain.   KUB today - stone has progressed into distal left ureter. Normal bowel gas. Rods in spine.    Surgical History Problems  1. History of  Appendectomy 2. History of  Arm Incision Right 3. History of  Bladder Surgery 4. History of  Breast Surgery 5. History of  Breast Surgery 6. History of  Cholecystectomy 7. History of  Knee Arthroscopy 8. History of  Knee Replacement 9. History of  Lower Back Surgery 10. History of  Neuroplasty Median Nerve At Carpal Tunnel 11. History of  Neuroplasty Median Nerve At Carpal Tunnel 12. History of  Tubal Ligation V25.2  Current Meds 1. ALPRAZolam ER 1 MG Oral Tablet Extended Release 24 Hour; Therapy: (Recorded:25Jun2013)  to 2. Aspirin 81 MG Oral Tablet; Therapy: (Recorded:25Jun2013) to 3. BuPROPion HCl 100 MG Oral Tablet; Therapy: (Recorded:25Jun2013) to 4. Diltiazem HCl ER Coated Beads 180 MG Oral Capsule Extended Release 24 Hour; Therapy:  (Recorded:25Jun2013) to 5. Flexeril 10 MG TABS; Therapy: (Recorded:25Jun2013) to 6. Furosemide 20 MG Oral Tablet; Therapy: (Recorded:25Jun2013) to 7. GlyBURIDE 5 MG Oral Tablet; Therapy: (Recorded:25Jun2013) to 8. Ketorolac Tromethamine 10 MG Oral Tablet; Therapy: (Recorded:25Jun2013) to 9. Lantus SOLN; Therapy: (Recorded:25Jun2013) to 10. Meperidine HCl 50 MG Oral Tablet; Therapy: (Recorded:25Jun2013) to 11. Micardis 80 MG Oral Tablet; Therapy: (Recorded:25Jun2013) to 12. Omeprazole 20 MG Oral Tablet Delayed Release; Therapy: (Recorded:25Jun2013) to 13. Tramadol-Acetaminophen 37.5-325 MG Oral Tablet; Therapy: (Recorded:25Jun2013) to 14. Venlafaxine HCl 100 MG Oral Tablet; Therapy: (Recorded:25Jun2013) to 15. Vitamin D (Ergocalciferol) 50000 UNIT Oral Capsule; Therapy: (Recorded:25Jun2013) to  Allergies Medication  1. Vicodin TABS 2. Erythromycin TABS 3. Metoprolol Tartrate TABS 4. Morphine Sulfate (PF) SOLN 5. Sulfa Drugs 6. Entex LIQD 7. Vasotec TABS  Family History Problems  1. Family history of  Death In The Family Father 2. Family history of  Death In The Family Mother 3. Family history of  Family Health Status Number Of Children 1 son, 1 daughter 4. Paternal history of  Liver Cancer 5. Family history of  Liver Cancer father 5. Family history of  Nephrolithiasis mother and sister 53.  Family history of  Renal Failure brother  Social History Problems  1. Caffeine Use 2 per day 2. Marital History - Currently Married 3. Retired From Work Denied  4. History of  Alcohol Use  Review of Systems Constitutional, cardiovascular, pulmonary, gastrointestinal and neurological system(s) were reviewed and pertinent findings if present are noted.     Vitals Vital Signs [Data Includes: Last 1 Day]  15Jul2013 09:16AM  Blood Pressure: 143 / 87 Temperature: 98.1 F Heart Rate: 93  Physical Exam Constitutional: Well nourished and well developed . No acute distress.  Pulmonary: No respiratory distress and normal respiratory rhythm and effort.  Cardiovascular: Heart rate and rhythm are normal . No peripheral edema.  Neuro/Psych:. Mood and affect are appropriate.    Results/Data Urine [Data Includes: Last 1 Day]   15Jul2013  COLOR STRAW   APPEARANCE CLEAR   SPECIFIC GRAVITY 1.010   pH 7.0   GLUCOSE NEG mg/dL  BILIRUBIN NEG   KETONE NEG mg/dL  BLOOD NEG   PROTEIN NEG mg/dL  UROBILINOGEN 0.2 mg/dL  NITRITE NEG   LEUKOCYTE ESTERASE NEG    Assessment Assessed  1. Ureteral Stone 592.1   Stone slowly progressing into left distal ureter.   Plan Health Maintenance (V70.0)  1. UA With REFLEX  Done: 15Jul2013 09:06AM Ureteral Stone (592.1)  2. KUB  Done: 15Jul2013 12:00AM 3. Follow-up Schedule Surgery Office  Follow-up  Requested for: 15Jul2013  Discussion/Summary     We reviewed the CT and KUB images today. Using the Understanding Kidney stone booklet, we discussed the nature, risks and benefits of continued stone passage with medical expulsion therapy (alpha blockers), cysto/stent/ureteroscopy/laser lithotripsy (fail to gain retrograde access, need for repeat therapy/multiple procedures, ureteral injury, stent pain among others), and ESWL (failure to fragment, failure to pass fragments, obstruction, need for additional procedures/stenting, bleeding requiring transfusion or embolization, infection, damage to kidney/ureter/other structures, pain among others). All questions answered. Pt elects to proceed with ESWL.   cc: Dr. Kevan Ny     Signatures Electronically signed by : Jerilee Field, M.D.; Jun 09 2012 10:05AM

## 2012-06-23 ENCOUNTER — Ambulatory Visit (HOSPITAL_COMMUNITY)
Admission: RE | Admit: 2012-06-23 | Discharge: 2012-06-23 | Disposition: A | Discharge status: Home / Self Care | Payer: Medicare Other | Source: Ambulatory Visit | Attending: Urology | Admitting: Urology

## 2012-06-23 ENCOUNTER — Encounter (HOSPITAL_COMMUNITY)
Admission: RE | Disposition: A | Discharge status: Home / Self Care | Payer: Self-pay | Source: Ambulatory Visit | Attending: Urology

## 2012-06-23 ENCOUNTER — Ambulatory Visit (HOSPITAL_COMMUNITY): Payer: Medicare Other

## 2012-06-23 ENCOUNTER — Encounter (HOSPITAL_COMMUNITY): Payer: Self-pay | Admitting: *Deleted

## 2012-06-23 DIAGNOSIS — E119 Type 2 diabetes mellitus without complications: Secondary | ICD-10-CM | POA: Insufficient documentation

## 2012-06-23 DIAGNOSIS — N201 Calculus of ureter: Secondary | ICD-10-CM | POA: Insufficient documentation

## 2012-06-23 DIAGNOSIS — Z853 Personal history of malignant neoplasm of breast: Secondary | ICD-10-CM | POA: Insufficient documentation

## 2012-06-23 DIAGNOSIS — Z794 Long term (current) use of insulin: Secondary | ICD-10-CM | POA: Insufficient documentation

## 2012-06-23 DIAGNOSIS — I10 Essential (primary) hypertension: Secondary | ICD-10-CM | POA: Insufficient documentation

## 2012-06-23 DIAGNOSIS — Z79899 Other long term (current) drug therapy: Secondary | ICD-10-CM | POA: Insufficient documentation

## 2012-06-23 HISTORY — DX: Calculus of kidney: N20.0

## 2012-06-23 LAB — GLUCOSE, CAPILLARY: Glucose-Capillary: 213 mg/dL — ABNORMAL HIGH (ref 70–99)

## 2012-06-23 SURGERY — LITHOTRIPSY, ESWL
Anesthesia: LOCAL | Laterality: Left

## 2012-06-23 MED ORDER — NAPROXEN SODIUM 220 MG PO TABS: 220.0000 mg | ORAL_TABLET | Freq: Two times a day (BID) | ORAL | Status: DC

## 2012-06-23 MED ORDER — TAMSULOSIN HCL 0.4 MG PO CAPS: 0.4000 mg | ORAL_CAPSULE | Freq: Every day | ORAL | Status: DC

## 2012-06-23 MED ORDER — TRAMADOL HCL 50 MG PO TABS: 50.0000 mg | ORAL_TABLET | Freq: Four times a day (QID) | ORAL | Status: AC | PRN

## 2012-06-23 MED ORDER — DIPHENHYDRAMINE HCL 25 MG PO CAPS
25.0000 mg | ORAL_CAPSULE | ORAL | Status: AC
  Administered 2012-06-23: 25 mg via ORAL
  Filled 2012-06-23: qty 1

## 2012-06-23 MED ORDER — KETOROLAC TROMETHAMINE 30 MG/ML IJ SOLN: 30.0000 mg | Freq: Three times a day (TID) | INTRAMUSCULAR | Status: DC | PRN

## 2012-06-23 MED ORDER — CIPROFLOXACIN HCL 500 MG PO TABS
500.0000 mg | ORAL_TABLET | ORAL | Status: AC
  Administered 2012-06-23: 500 mg via ORAL
  Filled 2012-06-23: qty 1

## 2012-06-23 MED ORDER — ASPIRIN 81 MG PO TABS: 81.0000 mg | ORAL_TABLET | Freq: Every day | ORAL | Status: AC

## 2012-06-23 MED ORDER — DIAZEPAM 5 MG PO TABS
10.0000 mg | ORAL_TABLET | ORAL | Status: AC
  Administered 2012-06-23: 10 mg via ORAL
  Filled 2012-06-23: qty 2

## 2012-06-23 MED ORDER — CIPROFLOXACIN HCL 250 MG PO TABS: 250.0000 mg | ORAL_TABLET | Freq: Two times a day (BID) | ORAL | Status: AC

## 2012-06-23 MED ORDER — DEXTROSE-NACL 5-0.45 % IV SOLN: INTRAVENOUS | Status: DC

## 2012-06-23 NOTE — Brief Op Note (Signed)
06/23/2012  8:28 AM  PATIENT:  [name]  61 y.o. female  PRE-OPERATIVE DIAGNOSIS:  Left Ureteral Stone  POST-OPERATIVE DIAGNOSIS:  Left ureteral stone  PROCEDURE:  Procedure(s) (LRB): EXTRACORPOREAL SHOCK WAVE LITHOTRIPSY (ESWL) (Left)  SURGEON:  Surgeon(s) and Role:    * Antony Haste, MD - Primary  ANESTHESIA:   IV sedation  DICTATION: .Other Dictation: Dictation Number scanned  PLAN OF CARE: Discharge to home after PACU  PATIENT DISPOSITION:  PACU - hemodynamically stable.   Delay start of Pharmacological VTE agent (>24hrs) due to surgical blood loss or risk of bleeding: yes

## 2012-06-23 NOTE — Interval H&P Note (Signed)
History and Physical Interval Note:  06/23/2012 7:56 AM  [name]  has presented today for surgery, with the diagnosis of Left Ureteral Stone  The various methods of treatment have been discussed with the patient and family. After consideration of risks, benefits and other options for treatment, the patient has consented to  Procedure(s) (LRB): EXTRACORPOREAL SHOCK WAVE LITHOTRIPSY (ESWL) (Left) as a surgical intervention .  The patient's history has been reviewed, patient examined, no change in status, stable for surgery.  I have reviewed the patient's chart and labs.  Questions were answered to the patient's satisfaction. KUB - stone in distal left ureter.   Antony Haste

## 2012-06-24 ENCOUNTER — Encounter (HOSPITAL_COMMUNITY): Payer: Self-pay

## 2012-07-15 ENCOUNTER — Ambulatory Visit (INDEPENDENT_AMBULATORY_CARE_PROVIDER_SITE_OTHER): Payer: Medicare Other | Admitting: General Surgery

## 2012-07-15 ENCOUNTER — Encounter (INDEPENDENT_AMBULATORY_CARE_PROVIDER_SITE_OTHER): Payer: Self-pay | Admitting: General Surgery

## 2012-07-15 VITALS — BP 136/72 | HR 80 | Temp 97.2°F | Resp 16 | Ht 64.0 in | Wt 206.0 lb

## 2012-07-15 DIAGNOSIS — L290 Pruritus ani: Secondary | ICD-10-CM

## 2012-07-15 DIAGNOSIS — K644 Residual hemorrhoidal skin tags: Secondary | ICD-10-CM

## 2012-07-15 DIAGNOSIS — K6289 Other specified diseases of anus and rectum: Secondary | ICD-10-CM

## 2012-07-15 NOTE — Patient Instructions (Addendum)
See handouts on fiber intake and hemorrhoids

## 2012-07-15 NOTE — Progress Notes (Signed)
Chief Complaint  Patient presents with  . Rectal Problems  . Rectal Pain    HISTORY: [name] is a 61 y.o. female who presents to the office with rectal pain and hemorrhoids.  This has occurred for the past few years.  Other symptoms include itching and inability to get clean.  She has tried OTC hemorrhoidal creams in the past with some help.  She is currently using analpram and nystatin powder.  Hard stools makes the symptoms worse.    It is intermittent in nature.  Her bowel habits are irregular and her bowel movements alternate from diarrhea to constipation.  Her fiber intake is low.  Her last colonoscopy was recently and she is due again in 2018 per her recollection.  She does not complain of any prolapsing tissue.   She has minimal bleeding on the toilet paper when she is having a "flare".   Past Medical History  Diagnosis Date  . Hypertension   . Hyperlipidemia   . Diabetes mellitus   . GERD (gastroesophageal reflux disease)   . Depression   . Arthritis   . Eczema   . Cancer 1996    breast   . Kidney stone 05/2012    Past Surgical History  Procedure Date  . Forearm fracture surgery   . Joint replacement   . Cholecystectomy   . Appendectomy   . Spine surgery   . Cervical disc surgery   . Neuroplasty / transposition median nerve at carpal tunnel bilateral   . Tubal ligation   . Incontinence surgery   . Breast surgery     partial mastectomy  . Mastectomy     right breast  . Breast reconstruction   . Insertion of tissue expander after mastectomy   . Breast reduction surgery   . Knee arthroscopy   . Total knee arthroplasty         Current Outpatient Prescriptions  Medication Sig Dispense Refill  . ALPRAZolam (XANAX) 1 MG tablet Take 1 mg by mouth 3 (three) times daily as needed. anxiety      . aspirin 81 MG tablet Take 1 tablet (81 mg total) by mouth daily.  30 tablet  0  . buPROPion (WELLBUTRIN SR) 100 MG 12 hr tablet Take 100 mg by mouth daily.      .  cetirizine (ZYRTEC) 10 MG tablet Take 10 mg by mouth daily.      . Cholecalciferol (VITAMIN D3) 1000 UNITS CAPS Take 1 capsule by mouth 2 (two) times daily.       Marland Kitchen diltiazem (CARDIZEM CD) 180 MG 24 hr capsule       . diltiazem (DILACOR XR) 180 MG 24 hr capsule Take 180 mg by mouth daily with breakfast.       . furosemide (LASIX) 20 MG tablet Take 20 mg by mouth daily.      Marland Kitchen glipiZIDE (GLUCOTROL) 10 MG tablet Take 10 mg by mouth daily.      . insulin glargine (LANTUS) 100 UNIT/ML injection Inject 40 Units into the skin at bedtime.       . naproxen sodium (ANAPROX) 220 MG tablet Take 1 tablet (220 mg total) by mouth 2 (two) times daily with a meal. 2 tabs twice a day      . nystatin (MYCOSTATIN) powder Apply to affected area 3 times daily  60 g  3  . omeprazole (PRILOSEC) 20 MG capsule Take 20 mg by mouth daily.      Marland Kitchen telmisartan (MICARDIS)  80 MG tablet Take 80 mg by mouth every evening. qpm      . venlafaxine (EFFEXOR) 100 MG tablet Take 100 mg by mouth 2 (two) times daily.      . cyclobenzaprine (FLEXERIL) 10 MG tablet Take 10 mg by mouth 3 (three) times daily as needed. Muscle spasms      . ketorolac (TORADOL) 10 MG tablet Take 10 mg by mouth every 6 (six) hours as needed. pain      . meperidine (DEMEROL) 50 MG tablet Take 50 mg by mouth every 4 (four) hours as needed. pain      . Tamsulosin HCl (FLOMAX) 0.4 MG CAPS Take 1 capsule (0.4 mg total) by mouth daily.  30 capsule  0  . traMADol-acetaminophen (ULTRACET) 37.5-325 MG per tablet Take 1 tablet by mouth every 6 (six) hours as needed. pain        Allergies  Allergen Reactions  . Dilaudid (Hydromorphone Hcl) Itching  . Entex Lq (Phenylephrine-Guaifenesin) Hypertension  . Lipitor (Atorvastatin) Other (See Comments)    Leg pain  . Percocet (Oxycodone-Acetaminophen) Itching  . Protonix (Pantoprazole Sodium)   . Sulfa Antibiotics Nausea Only  . Morphine And Related Rash  . Vasotec (Enalapril) Palpitations  . Vicodin  (Hydrocodone-Acetaminophen) Swelling and Rash    Family History  Problem Relation Age of Onset  . Diabetes Mother   . Hypertension Mother   . Cancer Father     liver    History   Social History  . Marital Status: Married    Spouse Name: N/A    Number of Children: N/A  . Years of Education: N/A   Social History Main Topics  . Smoking status: Never Smoker   . Smokeless tobacco: Never Used  . Alcohol Use: No  . Drug Use: No  . Sexually Active: None   Other Topics Concern  . None   Social History Narrative  . None    REVIEW OF SYSTEMS - PERTINENT POSITIVES ONLY: 12 point review of systems negative other than HPI and PMH  EXAM: Filed Vitals:   07/15/12 0909  BP: 136/72  Pulse: 80  Temp: 97.2 F (36.2 C)  Resp: 16    Gen:  No acute distress.  Well nourished and well groomed.   Neurological: Alert and oriented to person, place, and time. Coordination normal.  Head: Normocephalic and atraumatic.  Eyes: Conjunctivae are normal. Pupils are equal, round, and reactive to light. No scleral icterus.  Neck: Normal range of motion. Neck supple. No tracheal deviation or thyromegaly present.  No cervical lymphadenopathy. Cardiovascular: Normal rate, regular rhythm, normal heart sounds and intact distal pulses.  Exam reveals no gallop and no friction rub.  No murmur heard. Respiratory: Effort normal.  No respiratory distress. No chest wall tenderness. Breath sounds normal.  No wheezes, rales or rhonchi.  GI: Soft. Bowel sounds are normal. The abdomen is soft and nontender.  There is no rebound and no guarding.  Musculoskeletal: Normal range of motion. Extremities are nontender.  Skin: Skin is warm and dry. No rash noted. No diaphoresis. No erythema. No pallor. No clubbing, cyanosis, or edema.   Psychiatric: Normal mood and affect. Behavior is normal. Judgment and thought content normal.   Procedure: Anoscopy Surgeon: Maisie Fus Assistant: Ivory Broad After the risks and  benefits were explained, verbal consent was obtained for above procedure  Anesthesia: none Diagnosis: anal pain Findings: multiple external skin tags, healed posterior anal fissure, mild (grade 1 internal hemorrhoids), L gluteus skin breakdown  ASSESSMENT AND PLAN: Anal puritits- most likely due to skin irritation.  We discussed the causes of skin irritation and I recommended that she use desitin ointment for this area External skin tags- regulate bowel habits with fiber supplement and increased water intake Mild internal hemorrhoids- fiber supplements    Vanita Panda, MD Colon and Rectal Surgery / General Surgery [facility]      Visit Diagnoses: 1. Anal or rectal pain   2. Hemorrhoids, external   3. Anal itching     Primary Care Physician: Pearla Dubonnet, MD

## 2012-08-28 ENCOUNTER — Encounter (INDEPENDENT_AMBULATORY_CARE_PROVIDER_SITE_OTHER): Payer: Medicare Other | Admitting: General Surgery

## 2012-09-15 ENCOUNTER — Other Ambulatory Visit (INDEPENDENT_AMBULATORY_CARE_PROVIDER_SITE_OTHER): Payer: Self-pay | Admitting: Surgery

## 2012-12-25 ENCOUNTER — Encounter (INDEPENDENT_AMBULATORY_CARE_PROVIDER_SITE_OTHER): Payer: Self-pay

## 2012-12-25 NOTE — Progress Notes (Signed)
We received a fax from [facility] in [place] requesting refill on [facility] Pramoxine 1-2.5% cream.  Dr Maisie Fus denied refilling.  I faxed it back to [phone].

## 2013-01-12 ENCOUNTER — Encounter (INDEPENDENT_AMBULATORY_CARE_PROVIDER_SITE_OTHER): Payer: Self-pay

## 2013-01-12 NOTE — Progress Notes (Signed)
We received another fax from [facility] requesting refill on Pramoxine.  I faxed it back again letting them know it was denied.  Fax [phone]

## 2013-01-22 ENCOUNTER — Other Ambulatory Visit: Payer: Self-pay | Admitting: Internal Medicine

## 2013-01-22 DIAGNOSIS — N6320 Unspecified lump in the left breast, unspecified quadrant: Secondary | ICD-10-CM

## 2013-01-22 DIAGNOSIS — Z9011 Acquired absence of right breast and nipple: Secondary | ICD-10-CM

## 2013-02-04 ENCOUNTER — Ambulatory Visit
Admission: RE | Admit: 2013-02-04 | Discharge: 2013-02-04 | Disposition: A | Discharge status: Home / Self Care | Payer: Medicare Other | Source: Ambulatory Visit | Attending: Internal Medicine | Admitting: Internal Medicine

## 2013-02-04 DIAGNOSIS — N6320 Unspecified lump in the left breast, unspecified quadrant: Secondary | ICD-10-CM

## 2013-02-04 DIAGNOSIS — Z9011 Acquired absence of right breast and nipple: Secondary | ICD-10-CM

## 2013-05-13 ENCOUNTER — Telehealth (HOSPITAL_COMMUNITY): Payer: Self-pay | Admitting: Dietician

## 2013-05-13 NOTE — Telephone Encounter (Signed)
Received voicemail from [place], referrals at Dr. Marden Noble' office, at [phone]. Requesting nutrition referral for pt. Called back at [phone]. Victorino Dike to fax pt demographics and office notes. Will call to schedule appointment once referral is received.

## 2013-05-13 NOTE — Telephone Encounter (Signed)
Called and left message on pt voicemail at [phone].

## 2013-05-14 NOTE — Telephone Encounter (Signed)
Received voicemail from pt left on 05/13/13 at 1803. Called back at 1520. Appointment scheduled for 06/10/13 at 0900.

## 2013-06-10 ENCOUNTER — Encounter (HOSPITAL_COMMUNITY): Payer: Self-pay | Admitting: Dietician

## 2013-06-10 DIAGNOSIS — I10 Essential (primary) hypertension: Secondary | ICD-10-CM | POA: Insufficient documentation

## 2013-06-10 DIAGNOSIS — M1990 Unspecified osteoarthritis, unspecified site: Secondary | ICD-10-CM | POA: Insufficient documentation

## 2013-06-10 NOTE — Progress Notes (Signed)
Outpatient Initial Nutrition Assessment  Date:06/10/2013   Appt Start Time: 0857  Referring Physician: Dr. Marden Noble [facility] IM @ [facility]) Reason for Visit: DM  Nutrition Assessment:  Height: 5' 3.5" (161.3 cm)   Weight: 204 lb (92.534 kg)   IBW: 118# %IBW: 172% UBW: 218# %UBW: 94%  Body mass index is 35.57 kg/(m^2). Meets criteria for obesity, class II. Goal Weight: 184# (10% loss of current weight) Weight hx: Pt reports UBW of 218# as of one year ago. She has lost 14# (6.4%) x 1 year. She is unable to provide additional weight hx.   Estimated nutritional needs:  Kcals/ day: 1400-1500  Protein (grams)/day: 74-93 Fluid (L)/ day: 1.4-1.5   PMH:  Past Medical History  Diagnosis Date  . Hypertension   . Hyperlipidemia   . Diabetes mellitus   . GERD (gastroesophageal reflux disease)   . Depression   . Arthritis   . Eczema   . Cancer 1996    breast   . Kidney stone 05/2012  . DJD (degenerative joint disease)   . HTN (hypertension)     Medications:  Current Outpatient Rx  Name  Route  Sig  Dispense  Refill  . ALPRAZolam (XANAX) 1 MG tablet   Oral   Take 1 mg by mouth at bedtime. anxiety         . aspirin 81 MG tablet   Oral   Take 1 tablet (81 mg total) by mouth daily.   30 tablet   0   . calcium-vitamin D (OSCAL WITH D) 500-200 MG-UNIT per tablet   Oral   Take 1 tablet by mouth.         . cephALEXin (KEFLEX) 500 MG capsule   Oral   Take 500 mg by mouth 4 (four) times daily.         . cetirizine (ZYRTEC) 10 MG tablet   Oral   Take 10 mg by mouth daily as needed.          . Cholecalciferol (VITAMIN D3) 1000 UNITS CAPS   Oral   Take 1 capsule by mouth 2 (two) times daily.          . cyclobenzaprine (FLEXERIL) 10 MG tablet   Oral   Take 10 mg by mouth as needed. Muscle spasms         . diltiazem (DILACOR XR) 180 MG 24 hr capsule   Oral   Take 180 mg by mouth daily with breakfast.          . furosemide (LASIX) 20 MG tablet   Oral    Take 20 mg by mouth daily.         Marland Kitchen glipiZIDE (GLUCOTROL) 10 MG tablet   Oral   Take 10 mg by mouth daily.         Marland Kitchen glucose blood test strip   Other   1 each by Other route every morning. Use as instructed         . glucose blood test strip   Other   1 each by Other route 2 (two) times daily before a meal. Use as instructed         . hydrocortisone-pramoxine (ANALPRAM-HC) 2.5-1 % rectal cream      APPLY TO ANUS AND SKIN TAGS THREE TIMES DAILY AS NEEDED FOR ITCHING, BURNING, AND SWELLING.   30 g   0   . ibuprofen (ADVIL,MOTRIN) 200 MG tablet   Oral   Take 400 mg by mouth as needed  for pain.         Marland Kitchen insulin glargine (LANTUS) 100 UNIT/ML injection   Subcutaneous   Inject 40 Units into the skin at bedtime.          . Melatonin 10 MG CAPS   Oral   Take 20 mg by mouth every evening.         . naproxen sodium (ANAPROX) 220 MG tablet   Oral   Take 220 mg by mouth 2 (two) times daily in the am and at bedtime..         . omeprazole (PRILOSEC) 20 MG capsule   Oral   Take 20 mg by mouth 2 (two) times daily in the am and at bedtime..          . telmisartan (MICARDIS) 80 MG tablet   Oral   Take 80 mg by mouth every evening. qpm         . venlafaxine (EFFEXOR) 100 MG tablet   Oral   Take 100 mg by mouth 2 (two) times daily.           Labs: CMP     Component Value Date/Time   NA 142 02/07/2011 0420   K 3.4* 02/07/2011 0420   CL 108 02/07/2011 0420   CO2 29 02/07/2011 0420   GLUCOSE 143* 02/07/2011 0420   BUN 8 02/07/2011 0420   CREATININE 0.61 02/07/2011 0420   CALCIUM 8.8 02/07/2011 0420   PROT 6.7 03/23/2008 1050   ALBUMIN 4.0 03/23/2008 1050   AST 24 03/23/2008 1050   ALT 36* 03/23/2008 1050   ALKPHOS 58 03/23/2008 1050   BILITOT 0.7 03/23/2008 1050   GFRNONAA >60 02/07/2011 0420   GFRAA  Value: >60        The eGFR has been calculated using the MDRD equation. This calculation has not been validated in all clinical situations. eGFR's persistently <60  mL/min signify possible Chronic Kidney Disease. 02/07/2011 0420    Lipid Panel     Component Value Date/Time   CHOL  Value: 122        ATP III CLASSIFICATION:  <200     mg/dL   Desirable  960-454  mg/dL   Borderline High  >=098    mg/dL   High        12/14/1476 0420   TRIG 164* 02/07/2011 0420   HDL 41 02/07/2011 0420   CHOLHDL 3.0 02/07/2011 0420   VLDL 33 02/07/2011 0420   LDLCALC  Value: 48        Total Cholesterol/HDL:CHD Risk Coronary Heart Disease Risk Table                     Men   Women  1/2 Average Risk   3.4   3.3  Average Risk       5.0   4.4  2 X Average Risk   9.6   7.1  3 X Average Risk  23.4   11.0        Use the calculated Patient Ratio above and the CHD Risk Table to determine the patient's CHD Risk.        ATP III CLASSIFICATION (LDL):  <100     mg/dL   Optimal  295-621  mg/dL   Near or Above                    Optimal  130-159  mg/dL   Borderline  308-657  mg/dL   High  >  190     mg/dL   Very High 1/61/0960 4540     Lab Results  Component Value Date   HGBA1C  Value: 7.4 (NOTE)                                                                       According to the ADA Clinical Practice Recommendations for 2011, when HbA1c is used as a screening test:   >=6.5%   Diagnostic of Diabetes Mellitus           (if abnormal result  is confirmed)  5.7-6.4%   Increased risk of developing Diabetes Mellitus  References:Diagnosis and Classification of Diabetes Mellitus,Diabetes Care,2011,34(Suppl 1):S62-S69 and Standards of Medical Care in         Diabetes - 2011,Diabetes Care,2011,34  (Suppl 1):S11-S61.* 02/06/2011   Lab Results  Component Value Date   LDLCALC  Value: 48        Total Cholesterol/HDL:CHD Risk Coronary Heart Disease Risk Table                     Men   Women  1/2 Average Risk   3.4   3.3  Average Risk       5.0   4.4  2 X Average Risk   9.6   7.1  3 X Average Risk  23.4   11.0        Use the calculated Patient Ratio above and the CHD Risk Table to determine the patient's CHD Risk.         ATP III CLASSIFICATION (LDL):  <100     mg/dL   Optimal  981-191  mg/dL   Near or Above                    Optimal  130-159  mg/dL   Borderline  478-295  mg/dL   High  >621     mg/dL   Very High 01/31/6577   CREATININE 0.61 02/07/2011     Lifestyle/ social habits: Ms. Vanwagner is a very pleasant lady who lives in [place], [place] [facility] [place]) with her husband. She presents here today with her granddaughter, whom she watches often during the summer and on weekends during the school year. She is not physically active; she does not currently exercise and has never had a hx of physical exercise. She reports chronic pain prevents her from being active. She reports her stress level as 9/10, citing "life" as her main stressor, but would not elaborate.   Nutrition hx/habits: Ms. Plotner reports that she has been "trying to eat healthier for the past year" and has lost weight by doing so. She reports that she has tried to eat more vegetables and has cut back on pasta potatoes, rice, and bread. She reports frustration that she is unable to get her blood sugar to target range despite her efforts. She reports she has had diabetes for 10 years and had hx of good control until 3-4 years ago. She reports her last Hgb A1c was between 7.9-8.1; it was in the 6 range during periods of good control prior to 3-4 years ago. CBGs have been running historically in the 200-300's. She has started checking AM fasting blood sugar at  minimum and struggles with checking postprandial blood sugars. Recently, she reports fasting CBG's between 107-128. Postprandial readings in the 300's.  She eats 3 meals per day and tries to abdide abide by a consistent meal schedule, however, she "forces" herself to eat breakfast. Beverages consist of unsweetened beverages. Admits to snacking in the afternoon and at night. She reports that she has difficulty balancing the amount of carbs in her diet. She eats out 2-3 times per month, mostly when she is going  into town to run errands. She frequents [employer], [employer], and [employer].   Diet recall: Breakfast (7-9 AM): coffee with splenda, 1 cup of cheerios, 2% milk, and 1 splenda; Lunch: tortilla wrap with chicken or tuna salad, diet Pepsi; Afternoon snack: pack of nabs OR chips (Ruffles OR cheese balls); Dinner (6:30-8:30 AM): stirfry vegetables and chicken, soy sauce; HS snack: salad with lettuce, boiled egg, tomatoes, cucumber, onion, and ranch dressing.   Nutrition Diagnosis: Inconsistent carbohydrate intake r/t knowledge deficit, diet recall AEB Hgb A1c: 7.9-8.1.  Nutrition Intervention: Nutrition rx: 1200 kcal NAS, diabetic diet; 3 meals per day (4-5 hours apart); no snacks; low calorie beverages only; physical activity as tolerated  Education/Counseling Provided: Educated pt on principles of diabetic diet. Discussed carbohydrate metabolism in relation to diabetes. Educated pt on basic self-management principles including: signs and symptoms of hyperglycemia and hypoglycemia, goals for fasting and postprandial blood sugars, goals for Hgb A1c, importance of checking feet, importance of keeping PCP appointments, and foot care. Educated pt on plate method, portion sizes, and sources of carbohydrate. Discussed importance of regular meal pattern. Discussed importance of adding sources of whole grains to diet to improve glycemic control. Also encouraged to choose low fat dairy, lean meats, and whole fruits and vegetables more often. Discussed options of artificial sweeteners and encouraged pt to use which brand she liked best. Educated pt on food label reading. Discussed nutritional content of foods commonly eaten and discussed healthier alternatives. Discussed importance of compliance to prevent further complications of disease. Educated pt on importance of physical activity (goal of at least 30 minutes 5 times per week) along with a healthy diet to achieve weight loss and glycemic goals. Encouraged slow,  moderate weight loss of 1-2# per week, or 7-10% of current body weight. Discussed physical activity options and community resources. Provided "Go With The Whole Grain". "Carb Cunting and Meal Planning", and "Diabetes and You" handouts. Used TeachBack to assess understanding.   Understanding, Motivation, Ability to Follow Recommendations: Expect fair compliance.  Monitoring and Evaluation: Goals: 1) 0.5-2# weight loss per week; 2) Physical activity as tolerated; 3) Hgb A1c < 7; 4) Fasting blood sugars: 70-130, Postprandial: less than 180; 5) Physical activity as tolerated  Recommendations: 1) For weight loss: 1200-1330 kcals/day; 2) Break physical activity up into smaller, more frequent sessions; 3) Try water exercises; 4) Talk to insurance company about Silver Sneakers benefit  F/U: PRN. Pt did not desire follow-up. Provided RD contact information.   Melody Haver, RD, LDN 06/10/2013  Appt EndTime: 1011

## 2013-06-10 NOTE — Progress Notes (Signed)
Previous documentation note closed out in error. Please disregard.

## 2014-04-27 ENCOUNTER — Ambulatory Visit: Payer: Medicare Other | Admitting: *Deleted

## 2014-06-15 ENCOUNTER — Encounter: Payer: Medicare Other | Attending: Internal Medicine | Admitting: *Deleted

## 2014-06-15 VITALS — Ht 64.0 in | Wt 207.5 lb

## 2014-06-15 DIAGNOSIS — E119 Type 2 diabetes mellitus without complications: Secondary | ICD-10-CM | POA: Insufficient documentation

## 2014-06-15 DIAGNOSIS — Z794 Long term (current) use of insulin: Secondary | ICD-10-CM | POA: Diagnosis not present

## 2014-06-15 DIAGNOSIS — Z713 Dietary counseling and surveillance: Secondary | ICD-10-CM | POA: Diagnosis not present

## 2014-06-15 NOTE — Patient Instructions (Signed)
Plan:  Aim for 3 Carb Choices per meal (45 grams) +/- 1 either way  Aim for 0-1 Carbs per snack if hungry  Include protein in moderation with your meals and snacks Consider using your 1/2 cup measure as a serving utensil Consider reading food labels for Total Carbohydrate of foods

## 2014-06-15 NOTE — Progress Notes (Signed)
  Medical Nutrition Therapy:  Appt start time: 1000 end time:  1130.  Assessment:  Primary concerns today: diabetes nutrition. Here with her husband, they share the shopping and cooking. SMBG several times a week, with reported range 170 - 300 mg/dl before meals. No activity right now, has membership to [facility] doing water aerobics class for an hour. No recent history of hypoglycemia.  Preferred Learning Style:   No preference indicated   Learning Readiness:   Ready  Change in progress  MEDICATIONS: see list, diabetes medications are Lantus and Glipizide   DIETARY INTAKE:  24-hr recall:  B ( AM): skips usually  Snk ( AM): no  L ( PM): left overs, sandwich or salad OR cheese crackers with cheese, fresh fruit Snk ( PM): yes, ongoing D ( PM): usually meat, no starch (no white stuff), vegetables every night, water, Crystal light or diet Pepsi Snk ( PM): cheese and crackers or other salty snacks usually Beverages: water, Crystal light or diet Pepsi  Usual physical activity: not right now with summer heat but would enjoy water aerobics  Estimated energy needs: 140 calories 158 g carbohydrates 105 g protein 39 g fat  Progress Towards Goal(s):  In progress.   Nutritional Diagnosis:  NB-1.1 Food and nutrition-related knowledge deficit As related to diabetes.  As evidenced by excessive carb intake.    Intervention:  Nutrition counseling and diabetes education initiated. Discussed Carb Counting as method of portion control, reading food labels, and benefits of increased activity.  Plan:  Aim for 3 Carb Choices per meal (45 grams) +/- 1 either way  Aim for 0-1 Carbs per snack if hungry  Include protein in moderation with your meals and snacks Consider using your 1/2 cup measure as a serving utensil Consider reading food labels for Total Carbohydrate of foods  Teaching Method Utilized: Visual, Auditory and Hands on  Handouts given during visit include: Living Well with  Diabetes Carb Counting and Food Label handouts Meal Plan Card  Barriers to learning/adherence to lifestyle change: history of depression and comfort eating  Demonstrated degree of understanding via:  Teach Back   Monitoring/Evaluation:  Dietary intake, exercise, reading food labels, DM medication optionsand body weight in 4 week(s).

## 2014-06-30 ENCOUNTER — Encounter: Payer: Medicare Other | Attending: Internal Medicine | Admitting: *Deleted

## 2014-06-30 VITALS — Ht 64.0 in | Wt 206.5 lb

## 2014-06-30 DIAGNOSIS — Z713 Dietary counseling and surveillance: Secondary | ICD-10-CM | POA: Insufficient documentation

## 2014-06-30 DIAGNOSIS — E119 Type 2 diabetes mellitus without complications: Secondary | ICD-10-CM | POA: Diagnosis present

## 2014-06-30 DIAGNOSIS — Z794 Long term (current) use of insulin: Secondary | ICD-10-CM | POA: Diagnosis not present

## 2014-06-30 NOTE — Progress Notes (Signed)
  Medical Nutrition Therapy:  Appt start time: 1115 end time:  1200.  Assessment:  Primary concerns today: diabetes nutrition follow up visit on 06/30/2014. Happy with 1 pound weight loss. Wants to improve portion size better. She states her BGs are improving, ranging from 133 - 277 mg/dl pre meal. She is checking once a day in the AM. Doesn't have a set schedule, often skips breakfast. Has gone back the [facility], is attending water aerobics 2-3 times a week and would like to add some resistance training soon.   Preferred Learning Style:   No preference indicated   Learning Readiness:   Ready  Change in progress  MEDICATIONS: see list, diabetes medications are Lantus now at 65 units and Glipizide   DIETARY INTAKE:  24-hr recall:  B ( AM): skips usually  Snk ( AM): no  L ( PM): left overs, sandwich or salad OR cheese crackers with cheese, fresh fruit Snk ( PM): yes, ongoing D ( PM): usually meat, no starch (no white stuff), vegetables every night, water, Crystal light or diet Pepsi Snk ( PM): cheese and crackers or other salty snacks usually Beverages: water, Crystal light or diet Pepsi  Usual physical activity: not right now with summer heat but would enjoy water aerobics  Estimated energy needs: 1400 calories 158 g carbohydrates 105 g protein 39 g fat  Progress Towards Goal(s):  In progress.   Nutritional Diagnosis:  NB-1.1 Food and nutrition-related knowledge deficit As related to diabetes.  As evidenced by excessive carb intake.    Intervention:  Nutrition counseling and diabetes education continued. Reviewed Carb Counting as method of portion control and the rationale of spreading these types of foods evenly throughout the day. Also encouraged her to continue reading food labels and to continue with her increased activity. Reveiewed Target Ranges for BG management and suggested she record BGs based on whether they are pre or post meal times.  Plan:  Aim for 3 Carb Choices per  meal (45 grams) +/- 1 either way  Aim for 0-1 Carbs per snack if hungry  Include protein in moderation with your meals and snacks Consider using your 1/2 cup measure as a serving utensil Consider reading food labels for Total Carbohydrate of foods Consider checking your BG before and 2 hours after one meal each day to assess how high your BGs go with meals   Teaching Method Utilized: Visual, Auditory and Hands on  Handouts given during visit include: BG Log Sheet  Barriers to learning/adherence to lifestyle change: history of depression and comfort eating  Demonstrated degree of understanding via:  Teach Back   Monitoring/Evaluation:  Dietary intake, exercise, reading food labels, DM medication options and body weight in 2 months

## 2014-06-30 NOTE — Patient Instructions (Signed)
Plan:  Aim for 3 Carb Choices per meal (45 grams) +/- 1 either way  Aim for 0-1 Carbs per snack if hungry  Include protein in moderation with your meals and snacks Consider using your 1/2 cup measure as a serving utensil Consider reading food labels for Total Carbohydrate of foods Consider checking your BG before and 2 hours after one meal each day to assess how high your BGs go with meals

## 2014-07-06 ENCOUNTER — Encounter: Payer: Self-pay | Admitting: *Deleted

## 2014-08-26 ENCOUNTER — Ambulatory Visit: Payer: Medicare Other | Admitting: *Deleted

## 2014-11-01 ENCOUNTER — Ambulatory Visit
Admission: RE | Admit: 2014-11-01 | Discharge: 2014-11-01 | Disposition: A | Discharge status: Home / Self Care | Payer: Medicare Other | Source: Ambulatory Visit | Attending: Internal Medicine | Admitting: Internal Medicine

## 2014-11-01 ENCOUNTER — Other Ambulatory Visit: Payer: Self-pay | Admitting: Internal Medicine

## 2014-11-01 DIAGNOSIS — R05 Cough: Secondary | ICD-10-CM

## 2014-11-01 DIAGNOSIS — R059 Cough, unspecified: Secondary | ICD-10-CM

## 2015-05-10 ENCOUNTER — Other Ambulatory Visit: Payer: Self-pay | Admitting: Internal Medicine

## 2015-05-10 ENCOUNTER — Ambulatory Visit
Admission: RE | Admit: 2015-05-10 | Discharge: 2015-05-10 | Disposition: A | Discharge status: Home / Self Care | Payer: Medicare Other | Source: Ambulatory Visit | Attending: Internal Medicine | Admitting: Internal Medicine

## 2015-05-10 DIAGNOSIS — M79672 Pain in left foot: Secondary | ICD-10-CM

## 2016-07-10 ENCOUNTER — Ambulatory Visit (INDEPENDENT_AMBULATORY_CARE_PROVIDER_SITE_OTHER): Payer: Medicare Other | Admitting: Podiatry

## 2016-07-10 ENCOUNTER — Encounter: Payer: Self-pay | Admitting: Podiatry

## 2016-07-10 ENCOUNTER — Ambulatory Visit (INDEPENDENT_AMBULATORY_CARE_PROVIDER_SITE_OTHER): Payer: Medicare Other

## 2016-07-10 VITALS — BP 137/68 | HR 82 | Resp 12

## 2016-07-10 DIAGNOSIS — R52 Pain, unspecified: Secondary | ICD-10-CM

## 2016-07-10 DIAGNOSIS — M722 Plantar fascial fibromatosis: Secondary | ICD-10-CM

## 2016-07-10 NOTE — Patient Instructions (Signed)
Today your diabetic foot screen demonstrated adequate circulation and adequate feelings in your right and left feet. X-rays taken today demonstrated midfoot wear and tear, osteoarthritis There is thickening within the arch band called suspect plantar fibromatosis which are benign thickenings within the band. Generally we do not actively treat these unless become extremely large and that is more unusual We applied tapings the right and left feet to see if support would reduce your symptoms of pain upon weight-bearing. If the tapings reduce the pain then I would recommend a more rigid shoe insert to be worn in athletic style shoe. Notify our office if you think that tapings were helpful and we will get a mold of your feet for custom foot orthotics    Plantar Fasciitis Plantar fasciitis is a painful foot condition that affects the heel. It occurs when the band of tissue that connects the toes to the heel bone (plantar fascia) becomes irritated. This can happen after exercising too much or doing other repetitive activities (overuse injury). The pain from plantar fasciitis can range from mild irritation to severe pain that makes it difficult for you to walk or move. The pain is usually worse in the morning or after you have been sitting or lying down for a while. CAUSES This condition may be caused by:  Standing for long periods of time.  Wearing shoes that do not fit.  Doing high-impact activities, including running, aerobics, and ballet.  Being overweight.  Having an abnormal way of walking (gait).  Having tight calf muscles.  Having high arches in your feet.  Starting a new athletic activity. SYMPTOMS The main symptom of this condition is heel pain. Other symptoms include:  Pain that gets worse after activity or exercise.  Pain that is worse in the morning or after resting.  Pain that goes away after you walk for a few minutes. DIAGNOSIS This condition may be diagnosed based on  your signs and symptoms. Your health care provider will also do a physical exam to check for:  A tender area on the bottom of your foot.  A high arch in your foot.  Pain when you move your foot.  Difficulty moving your foot. You may also need to have imaging studies to confirm the diagnosis. These can include:  X-rays.  Ultrasound.  MRI. TREATMENT  Treatment for plantar fasciitis depends on the severity of the condition. Your treatment may include:  Rest, ice, and over-the-counter pain medicines to manage your pain.  Exercises to stretch your calves and your plantar fascia.  A splint that holds your foot in a stretched, upward position while you sleep (night splint).  Physical therapy to relieve symptoms and prevent problems in the future.  Cortisone injections to relieve severe pain.  Extracorporeal shock wave therapy (ESWT) to stimulate damaged plantar fascia with electrical impulses. It is often used as a last resort before surgery.  Surgery, if other treatments have not worked after 12 months. HOME CARE INSTRUCTIONS  Take medicines only as directed by your health care provider.  Avoid activities that cause pain.  Roll the bottom of your foot over a bag of ice or a bottle of cold water. Do this for 20 minutes, 3-4 times a day.  Perform simple stretches as directed by your health care provider.  Try wearing athletic shoes with air-sole or gel-sole cushions or soft shoe inserts.  Wear a night splint while sleeping, if directed by your health care provider.  Keep all follow-up appointments with your health  care provider. PREVENTION   Do not perform exercises or activities that cause heel pain.  Consider finding low-impact activities if you continue to have problems.  Lose weight if you need to. The best way to prevent plantar fasciitis is to avoid the activities that aggravate your plantar fascia. SEEK MEDICAL CARE IF:  Your symptoms do not go away after  treatment with home care measures.  Your pain gets worse.  Your pain affects your ability to move or do your daily activities.   This information is not intended to replace advice given to you by your health care provider. Make sure you discuss any questions you have with your health care provider.   Document Released: 08/07/2001 Document Revised: 08/03/2015 Document Reviewed: 09/22/2014 Elsevier Interactive Patient Education 2016 [place]. Diabetes and Foot Care Diabetes may cause you to have problems because of poor blood supply (circulation) to your feet and legs. This may cause the skin on your feet to become thinner, break easier, and heal more slowly. Your skin may become dry, and the skin may peel and crack. You may also have nerve damage in your legs and feet causing decreased feeling in them. You may not notice minor injuries to your feet that could lead to infections or more serious problems. Taking care of your feet is one of the most important things you can do for yourself.  HOME CARE INSTRUCTIONS  Wear shoes at all times, even in the house. Do not go barefoot. Bare feet are easily injured.  Check your feet daily for blisters, cuts, and redness. If you cannot see the bottom of your feet, use a mirror or ask someone for help.  Wash your feet with warm water (do not use hot water) and mild soap. Then pat your feet and the areas between your toes until they are completely dry. Do not soak your feet as this can dry your skin.  Apply a moisturizing lotion or petroleum jelly (that does not contain alcohol and is unscented) to the skin on your feet and to dry, brittle toenails. Do not apply lotion between your toes.  Trim your toenails straight across. Do not dig under them or around the cuticle. File the edges of your nails with an emery board or nail file.  Do not cut corns or calluses or try to remove them with medicine.  Wear clean socks or stockings every day. Make sure they  are not too tight. Do not wear knee-high stockings since they may decrease blood flow to your legs.  Wear shoes that fit properly and have enough cushioning. To break in new shoes, wear them for just a few hours a day. This prevents you from injuring your feet. Always look in your shoes before you put them on to be sure there are no objects inside.  Do not cross your legs. This may decrease the blood flow to your feet.  If you find a minor scrape, cut, or break in the skin on your feet, keep it and the skin around it clean and dry. These areas may be cleansed with mild soap and water. Do not cleanse the area with peroxide, alcohol, or iodine.  When you remove an adhesive bandage, be sure not to damage the skin around it.  If you have a wound, look at it several times a day to make sure it is healing.  Do not use heating pads or hot water bottles. They may burn your skin. If you have lost feeling in  your feet or legs, you may not know it is happening until it is too late.  Make sure your health care provider performs a complete foot exam at least annually or more often if you have foot problems. Report any cuts, sores, or bruises to your health care provider immediately. SEEK MEDICAL CARE IF:   You have an injury that is not healing.  You have cuts or breaks in the skin.  You have an ingrown nail.  You notice redness on your legs or feet.  You feel burning or tingling in your legs or feet.  You have pain or cramps in your legs and feet.  Your legs or feet are numb.  Your feet always feel cold. SEEK IMMEDIATE MEDICAL CARE IF:   There is increasing redness, swelling, or pain in or around a wound.  There is a red line that goes up your leg.  Pus is coming from a wound.  You develop a fever or as directed by your health care provider.  You notice a bad smell coming from an ulcer or wound.   This information is not intended to replace advice given to you by your health care  provider. Make sure you discuss any questions you have with your health care provider.   Document Released: 11/09/2000 Document Revised: 07/15/2013 Document Reviewed: 04/21/2013 Elsevier Interactive Patient Education [employer].

## 2016-07-10 NOTE — Progress Notes (Signed)
   Subjective:    Patient ID: [name], female    DOB: 1951/08/30, 65 y.o.   MRN: [id number]  HPI    This patient presents today complaining of bilateral arch pain and dorsal foot pain over the past year gradually worsening over the past 6 months the symptoms occur after standing or walking a proximally 5 minutes and will resolve with rest and elevation. The discomfort increases with weightbearing and reduces with rest. Patient has tried a variety of supportive shoes without any change in symptoms. Patient is also notice some palpable bumps in the fascial bands the right and left feet in this time. With tenderness in these areas as well.  Patient is a diabetic and denies any history of foot ulceration, claudication or amputation    Review of Systems  HENT: Positive for sinus pressure.        Objective:   Physical Exam  Approximately 5 foot 4 and 198 pounds  Orientated 3  Vascular: No calf edema or calf tenderness bilaterally DP and PT pulses 2/4 bilaterally Capillary reflex immediate bilaterally  Neurological: Sensation to 10 g monofilament wire intact 5/5 bilaterally Vibratory sensation equal reactive bilaterally Ankle reflex equal and reactive bilaterally  Dermatological: No open skin lesions bilaterally  Musculoskeletal: HAV deformities bilaterally Hammertoe second bilaterally Palpable tenderness dorsal midfoot right and left with overlying palpable bony prominences Palpable thickening proximal medial fascial band right 10 mm with palpable tenderness in the general area Palpable thickening within the medial fascial band left slightly proximal to first MPJ 5 mm with palpable tenderness in that general area  X-ray examination weightbearing right foot dated 07/10/2016  No fracture or dislocation noted Decreased bone density in all views Posterior heel spur Decreased joint spaces Lisfranc and chopart   Radiographic impression for weightbearing x-ray right  foot No acute bony abnormality noted in the right foot Midfoot osteoarthritis  X-ray examination weightbearing left foot dated 07/10/2016  Intact bony structures without fracture and/or dislocation Decreased bone densities noted in all views Posterior and inferior calcaneal spurs Decreased joint spaces chopart and Lisfranc joints  Radiographic impression weightbearing x-ray left foot No acute bony abnormality noted left foot Osteoarthritis midfoot    Assessment & Plan:   Assessment: Satisfactory neurovascular status Midfoot osteoarthritis, bilaterally Plantar fasciitis bilaterally Plantar fibromatosis bilaterally  Plan: I reviewed the results of examine x-ray with patient today. We discussed treatment options for plantar fasciitis in detail. Today I'm going to recommend application of fascial tapings right left to see if this provides reduction of discomfort. If she notices reduction from these fascial tapings I would recommend a accommodative semirigid are rigid orthotics. If no improvement we'll not recommend foot orthotic and will maintain sturdy athletic style shoes. We discussed midfoot osteoarthritis as primary source of her dorsal pain. Also, we discussed exercise program including walking to tolerance followed by cycling repeating  I also informed patient that her diabetic foot exam today was satisfactory  Reappoint if patient feels relief from fascial tapings for accommodative foot orthotics

## 2016-07-31 ENCOUNTER — Ambulatory Visit: Payer: Medicare Other | Admitting: Podiatry

## 2017-09-30 ENCOUNTER — Other Ambulatory Visit: Payer: Self-pay | Admitting: Internal Medicine

## 2017-09-30 DIAGNOSIS — Z1231 Encounter for screening mammogram for malignant neoplasm of breast: Secondary | ICD-10-CM

## 2017-10-28 ENCOUNTER — Ambulatory Visit
Admission: RE | Admit: 2017-10-28 | Discharge: 2017-10-28 | Disposition: A | Discharge status: Home / Self Care | Payer: Medicare Other | Source: Ambulatory Visit | Attending: Internal Medicine | Admitting: Internal Medicine

## 2017-10-28 DIAGNOSIS — Z1231 Encounter for screening mammogram for malignant neoplasm of breast: Secondary | ICD-10-CM

## 2018-09-22 ENCOUNTER — Other Ambulatory Visit: Payer: Self-pay | Admitting: Internal Medicine

## 2018-09-22 DIAGNOSIS — Z1231 Encounter for screening mammogram for malignant neoplasm of breast: Secondary | ICD-10-CM

## 2018-11-04 ENCOUNTER — Ambulatory Visit
Admission: RE | Admit: 2018-11-04 | Discharge: 2018-11-04 | Disposition: A | Discharge status: Home / Self Care | Payer: Medicare Other | Source: Ambulatory Visit | Attending: Internal Medicine | Admitting: Internal Medicine

## 2018-11-04 DIAGNOSIS — Z1231 Encounter for screening mammogram for malignant neoplasm of breast: Secondary | ICD-10-CM

## 2019-02-25 ENCOUNTER — Other Ambulatory Visit: Payer: Self-pay | Admitting: Internal Medicine

## 2019-02-25 ENCOUNTER — Ambulatory Visit
Admission: RE | Admit: 2019-02-25 | Discharge: 2019-02-25 | Disposition: A | Discharge status: Home / Self Care | Payer: Medicare Other | Source: Ambulatory Visit | Attending: Internal Medicine | Admitting: Internal Medicine

## 2019-02-25 DIAGNOSIS — R053 Chronic cough: Secondary | ICD-10-CM

## 2019-02-25 DIAGNOSIS — R05 Cough: Secondary | ICD-10-CM

## 2019-08-31 DIAGNOSIS — R490 Dysphonia: Secondary | ICD-10-CM | POA: Insufficient documentation

## 2019-09-03 ENCOUNTER — Other Ambulatory Visit: Payer: Self-pay | Admitting: Otolaryngology

## 2019-09-03 DIAGNOSIS — R590 Localized enlarged lymph nodes: Secondary | ICD-10-CM

## 2019-09-07 ENCOUNTER — Ambulatory Visit
Admission: RE | Admit: 2019-09-07 | Discharge: 2019-09-07 | Disposition: A | Discharge status: Home / Self Care | Payer: Medicare Other | Source: Ambulatory Visit | Attending: Otolaryngology | Admitting: Otolaryngology

## 2019-09-07 DIAGNOSIS — R590 Localized enlarged lymph nodes: Secondary | ICD-10-CM

## 2019-09-08 ENCOUNTER — Other Ambulatory Visit: Payer: Self-pay

## 2019-09-08 ENCOUNTER — Ambulatory Visit: Payer: Medicare Other | Attending: Otolaryngology

## 2019-09-08 DIAGNOSIS — R490 Dysphonia: Secondary | ICD-10-CM | POA: Insufficient documentation

## 2019-09-08 NOTE — Patient Instructions (Signed)
  Practice 10 abdominal breaths before you practice PHoRTE exercises  Sing using a straw for 2 minutes before before you practice PHoRTE exercises   [place]  1) Hold "Laurie" 10 times   As long as you can as strong as you can - push from your abdominal muscles!   [email or web address]  2) Count 1-10 (skip 7)   Start at as low pitch as you can, glide to as high pitch as you can and down as low as you can   [email or web address]  3) Say 10 sentences -two different ways   (a) like you are calling over the fence to your neighbor in a higher-pitch voice  (b) like you are scolding your dog in a LOW authoritative voice   [email or web address]

## 2019-09-09 NOTE — Therapy (Signed)
[facility] [address] [place], [place], [address] Phone: [phone]   Fax:  [phone]  Speech Language Pathology Evaluation  Patient Details  Name: [name] MRN: [id number] Date of Birth: [birth year] Referring Provider (SLP): Helayne Seminole., MD   Encounter Date: 09/08/2019  End of Session - 09/09/19 0836    Visit Number  1    Number of Visits  13    Date for SLP Re-Evaluation  11/04/19    SLP Start Time  0850    SLP Stop Time   0931    SLP Time Calculation (min)  41 min    Activity Tolerance  Patient tolerated treatment well;Other (comment)   appeared anxious/nervous for first 25-30 minutes      Past Medical History:  Diagnosis Date  . Arthritis   . Cancer ([place]) 1996   breast   . Depression   . Diabetes mellitus   . DJD (degenerative joint disease)   . Eczema   . GERD (gastroesophageal reflux disease)   . HTN (hypertension)   . Hyperlipidemia   . Hypertension   . Kidney stone 05/2012    Past Surgical History:  Procedure Laterality Date  . APPENDECTOMY    . BREAST RECONSTRUCTION    . BREAST REDUCTION SURGERY    . BREAST SURGERY     partial mastectomy  . CERVICAL DISC SURGERY    . CHOLECYSTECTOMY    . FOREARM FRACTURE SURGERY    . INCONTINENCE SURGERY    . INSERTION OF TISSUE EXPANDER AFTER MASTECTOMY    . JOINT REPLACEMENT    . KNEE ARTHROSCOPY    . MASTECTOMY     right breast  . NEUROPLASTY / TRANSPOSITION MEDIAN NERVE AT CARPAL TUNNEL BILATERAL    . SPINE SURGERY    . TOTAL KNEE ARTHROPLASTY    . TUBAL LIGATION      There were no vitals filed for this visit.  Subjective Assessment - 09/09/19 0829    Subjective  Pt appears somewhat nervous/anxious upon entering ST room. Noted pt with anxiety dx in PMH.    Patient is accompained by:  Family member   husband   Currently in Pain?  No/denies         SLP Evaluation [facility] - 09/09/19 [id number]      SLP Visit Information   SLP Received On  09/08/19    Referring Provider (SLP)  Helayne Seminole., MD    Onset Date  "about a year ago"    Medical Diagnosis  Vocal fold atrophy      General Information   HPI  Pt has struggled with harsh, "raspy" (pt described) voice following a sore throat approx one year ago. Other pertinent pt PMH includes anxiety, DM, GERD and chronic cough. Pt states she does not bring anything up wihen she clears her throat.       Balance Screen   Has the patient fallen in the past 6 months  No      Prior Functional Status   Cognitive/Linguistic Baseline  Within functional limits      Cognition   Overall Cognitive Status  Within Functional Limits for tasks assessed      Auditory Comprehension   Overall Auditory Comprehension  Appears within functional limits for tasks assessed      Verbal Expression   Overall Verbal Expression  Appears within functional limits for tasks assessed      Oral Motor/Sensory Function   Overall Oral Motor/Sensory  Function  Appears within functional limits for tasks assessed      Motor Speech   Phonation  Other (comment)   mod harsh; sustain /a/ avg 8.3 seconds (below WNL)   Phonation  Impaired    Vocal Abuses  Habitual Cough/Throat Clear   9 times in 40 minutes     Standardized Assessments   Standardized Assessments   Other Assessment   Voice Related Quality of Life (V-RQOL)   Other Assessment  Score = 13 (good to excellent quality of life); rating of voice over last two weeks="fair". Pt reports her voice more of a constant annoyance rather than bothersome to her.                       SLP Education - 09/09/19 [phone]    Education Details  Exercises one and for PHorTE, need for abdominal breathing as pt is chest breather    Person(s) Educated  Patient;Spouse    Methods  Explanation;Demonstration;Verbal cues    Comprehension  Verbal cues required;Returned demonstration;Verbalized understanding;Need further instruction       SLP  Short Term Goals - 09/09/19 0844      SLP SHORT TERM GOAL #1   Title  pt will answer questions with sentence responses using abdominal breathing (AB) 75% of the time    Time  4    Period  Weeks    Status  New      SLP SHORT TERM GOAL #2   Title  pt will report following 2 aspects of vocal hygiene between 4 therapy sessions    Time  4    Period  Weeks    Status  New      SLP SHORT TERM GOAL #3   Title  pt will perform PHorTE exercises/vocal strengthening exercises with occasional min A in 3 sessions    Time  4    Period  Weeks    Status  New       SLP Long Term Goals - 09/09/19 0846      SLP LONG TERM GOAL #1   Title  pt will perform PHorTE exercises/vocal strengthening exercises with rare min A in 3 sessions    Time  6    Period  Weeks   or 13 total sessions, fo ral LTGs   Status  New      SLP LONG TERM GOAL #2   Title  pt will use abdominal breathing in 7 minutes simple-mod complex conversation with modified independence in 2 sessions    Time  6    Period  Weeks    Status  New      SLP LONG TERM GOAL #3   Title  pt will score > 93 on V-RQOL    Time  6    Period  Weeks    Status  New      SLP LONG TERM GOAL #4   Title  pt will incr sustained /a/ measure to > 12.0 seconds average    Time  6    Period  Weeks    Status  New       Plan - 09/09/19 0837    Clinical Impression Statement  Pt, diagnosed with GERD 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 Lind Guest who presents today with mod harsh voice, demonstrating shallow chest breathing in conversation, and reduced vocal hygiene (good water intake however daily incr'd caffeine use - daily two mugs coffee and one  16-20 oz. caffeinated drink). 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 Pt desires to undergo skilled ST focusing on vocalfold strenthening exericses (PHorTE), incr'ing pt's proper vocal hygiene measures, and making abdomoinal breathing her norm.     Speech Therapy Frequency  2x / week    Duration  --   6 weeks, or 13 total visits   Treatment/Interventions  SLP instruction and feedback;Compensatory strategies;Patient/family education;Other (comment);Internal/external aids   vocal exercises, vocal hygiene education   Potential to Achieve Goals  Good    SLP Home Exercise Plan  (partial) provided today    Consulted and Agree with Plan of Care  Patient       Patient will benefit from skilled therapeutic intervention in order to improve the following deficits and impairments:   Voice hoarseness - Plan: SLP plan of care cert/re-cert    Problem List Patient Active Problem List   Diagnosis Date Noted  . DJD (degenerative joint disease)   . HTN (hypertension)   . Hemorrhoidal skin tags, multiple 06/02/2012  . Prolapsed internal hemorrhoids 06/02/2012    [facility] ,MS, CCC-SLP  09/09/2019, 8:50 AM  [facility] [address] [place], [place], [address] Phone: [phone]   Fax:  [phone]  Name: [name] MRN: [id number] Date of Birth: 1951/11/08

## 2019-09-14 ENCOUNTER — Ambulatory Visit: Payer: Medicare Other

## 2019-10-20 ENCOUNTER — Encounter: Payer: Medicare Other | Admitting: Speech Pathology

## 2019-11-05 ENCOUNTER — Other Ambulatory Visit: Payer: Self-pay | Admitting: Internal Medicine

## 2019-11-05 DIAGNOSIS — Z1231 Encounter for screening mammogram for malignant neoplasm of breast: Secondary | ICD-10-CM

## 2019-12-25 ENCOUNTER — Ambulatory Visit: Payer: Medicare Other

## 2019-12-25 ENCOUNTER — Other Ambulatory Visit: Payer: Self-pay | Admitting: Internal Medicine

## 2019-12-25 DIAGNOSIS — Z79899 Other long term (current) drug therapy: Secondary | ICD-10-CM

## 2019-12-25 DIAGNOSIS — E559 Vitamin D deficiency, unspecified: Secondary | ICD-10-CM

## 2020-01-29 ENCOUNTER — Ambulatory Visit: Payer: Medicare PPO

## 2020-01-29 ENCOUNTER — Other Ambulatory Visit: Payer: Medicare PPO

## 2020-02-16 DIAGNOSIS — Z1159 Encounter for screening for other viral diseases: Secondary | ICD-10-CM | POA: Diagnosis not present

## 2020-02-19 DIAGNOSIS — K5730 Diverticulosis of large intestine without perforation or abscess without bleeding: Secondary | ICD-10-CM | POA: Diagnosis not present

## 2020-02-19 DIAGNOSIS — K644 Residual hemorrhoidal skin tags: Secondary | ICD-10-CM | POA: Diagnosis not present

## 2020-02-19 DIAGNOSIS — Z1211 Encounter for screening for malignant neoplasm of colon: Secondary | ICD-10-CM | POA: Diagnosis not present

## 2020-02-19 DIAGNOSIS — D123 Benign neoplasm of transverse colon: Secondary | ICD-10-CM | POA: Diagnosis not present

## 2020-02-19 DIAGNOSIS — D124 Benign neoplasm of descending colon: Secondary | ICD-10-CM | POA: Diagnosis not present

## 2020-02-23 DIAGNOSIS — E782 Mixed hyperlipidemia: Secondary | ICD-10-CM | POA: Diagnosis not present

## 2020-02-23 DIAGNOSIS — E1165 Type 2 diabetes mellitus with hyperglycemia: Secondary | ICD-10-CM | POA: Diagnosis not present

## 2020-02-23 DIAGNOSIS — J45998 Other asthma: Secondary | ICD-10-CM | POA: Diagnosis not present

## 2020-02-23 DIAGNOSIS — M1990 Unspecified osteoarthritis, unspecified site: Secondary | ICD-10-CM | POA: Diagnosis not present

## 2020-02-23 DIAGNOSIS — I10 Essential (primary) hypertension: Secondary | ICD-10-CM | POA: Diagnosis not present

## 2020-02-23 DIAGNOSIS — F329 Major depressive disorder, single episode, unspecified: Secondary | ICD-10-CM | POA: Diagnosis not present

## 2020-02-23 DIAGNOSIS — E785 Hyperlipidemia, unspecified: Secondary | ICD-10-CM | POA: Diagnosis not present

## 2020-02-23 DIAGNOSIS — E1140 Type 2 diabetes mellitus with diabetic neuropathy, unspecified: Secondary | ICD-10-CM | POA: Diagnosis not present

## 2020-02-23 DIAGNOSIS — M189 Osteoarthritis of first carpometacarpal joint, unspecified: Secondary | ICD-10-CM | POA: Diagnosis not present

## 2020-02-25 DIAGNOSIS — D123 Benign neoplasm of transverse colon: Secondary | ICD-10-CM | POA: Diagnosis not present

## 2020-02-25 DIAGNOSIS — D124 Benign neoplasm of descending colon: Secondary | ICD-10-CM | POA: Diagnosis not present

## 2020-02-29 DIAGNOSIS — M064 Inflammatory polyarthropathy: Secondary | ICD-10-CM | POA: Diagnosis not present

## 2020-02-29 DIAGNOSIS — Z79899 Other long term (current) drug therapy: Secondary | ICD-10-CM | POA: Diagnosis not present

## 2020-03-09 DIAGNOSIS — F325 Major depressive disorder, single episode, in full remission: Secondary | ICD-10-CM | POA: Diagnosis not present

## 2020-03-09 DIAGNOSIS — E785 Hyperlipidemia, unspecified: Secondary | ICD-10-CM | POA: Diagnosis not present

## 2020-03-09 DIAGNOSIS — F419 Anxiety disorder, unspecified: Secondary | ICD-10-CM | POA: Diagnosis not present

## 2020-03-09 DIAGNOSIS — Z853 Personal history of malignant neoplasm of breast: Secondary | ICD-10-CM | POA: Diagnosis not present

## 2020-03-09 DIAGNOSIS — Z6835 Body mass index (BMI) 35.0-35.9, adult: Secondary | ICD-10-CM | POA: Diagnosis not present

## 2020-03-09 DIAGNOSIS — Z8249 Family history of ischemic heart disease and other diseases of the circulatory system: Secondary | ICD-10-CM | POA: Diagnosis not present

## 2020-03-09 DIAGNOSIS — Z794 Long term (current) use of insulin: Secondary | ICD-10-CM | POA: Diagnosis not present

## 2020-03-09 DIAGNOSIS — Z833 Family history of diabetes mellitus: Secondary | ICD-10-CM | POA: Diagnosis not present

## 2020-03-09 DIAGNOSIS — Z809 Family history of malignant neoplasm, unspecified: Secondary | ICD-10-CM | POA: Diagnosis not present

## 2020-03-09 DIAGNOSIS — Z823 Family history of stroke: Secondary | ICD-10-CM | POA: Diagnosis not present

## 2020-03-09 DIAGNOSIS — F439 Reaction to severe stress, unspecified: Secondary | ICD-10-CM | POA: Diagnosis not present

## 2020-03-09 DIAGNOSIS — Z7982 Long term (current) use of aspirin: Secondary | ICD-10-CM | POA: Diagnosis not present

## 2020-03-09 DIAGNOSIS — I10 Essential (primary) hypertension: Secondary | ICD-10-CM | POA: Diagnosis not present

## 2020-03-09 DIAGNOSIS — E1142 Type 2 diabetes mellitus with diabetic polyneuropathy: Secondary | ICD-10-CM | POA: Diagnosis not present

## 2020-03-09 DIAGNOSIS — G8929 Other chronic pain: Secondary | ICD-10-CM | POA: Diagnosis not present

## 2020-03-09 DIAGNOSIS — Z885 Allergy status to narcotic agent status: Secondary | ICD-10-CM | POA: Diagnosis not present

## 2020-03-10 DIAGNOSIS — R079 Chest pain, unspecified: Secondary | ICD-10-CM | POA: Diagnosis not present

## 2020-03-10 DIAGNOSIS — F324 Major depressive disorder, single episode, in partial remission: Secondary | ICD-10-CM | POA: Diagnosis not present

## 2020-03-10 DIAGNOSIS — M791 Myalgia, unspecified site: Secondary | ICD-10-CM | POA: Diagnosis not present

## 2020-03-25 DIAGNOSIS — M1990 Unspecified osteoarthritis, unspecified site: Secondary | ICD-10-CM | POA: Diagnosis not present

## 2020-03-25 DIAGNOSIS — J45998 Other asthma: Secondary | ICD-10-CM | POA: Diagnosis not present

## 2020-03-25 DIAGNOSIS — M189 Osteoarthritis of first carpometacarpal joint, unspecified: Secondary | ICD-10-CM | POA: Diagnosis not present

## 2020-03-25 DIAGNOSIS — E1165 Type 2 diabetes mellitus with hyperglycemia: Secondary | ICD-10-CM | POA: Diagnosis not present

## 2020-03-25 DIAGNOSIS — E1140 Type 2 diabetes mellitus with diabetic neuropathy, unspecified: Secondary | ICD-10-CM | POA: Diagnosis not present

## 2020-03-25 DIAGNOSIS — E785 Hyperlipidemia, unspecified: Secondary | ICD-10-CM | POA: Diagnosis not present

## 2020-03-25 DIAGNOSIS — I10 Essential (primary) hypertension: Secondary | ICD-10-CM | POA: Diagnosis not present

## 2020-03-25 DIAGNOSIS — E782 Mixed hyperlipidemia: Secondary | ICD-10-CM | POA: Diagnosis not present

## 2020-03-25 DIAGNOSIS — F329 Major depressive disorder, single episode, unspecified: Secondary | ICD-10-CM | POA: Diagnosis not present

## 2020-03-29 ENCOUNTER — Other Ambulatory Visit: Payer: Self-pay

## 2020-03-29 ENCOUNTER — Ambulatory Visit
Admission: RE | Admit: 2020-03-29 | Discharge: 2020-03-29 | Disposition: A | Discharge status: Home / Self Care | Payer: Medicare PPO | Source: Ambulatory Visit | Attending: Internal Medicine | Admitting: Internal Medicine

## 2020-03-29 DIAGNOSIS — M8589 Other specified disorders of bone density and structure, multiple sites: Secondary | ICD-10-CM | POA: Diagnosis not present

## 2020-03-29 DIAGNOSIS — E559 Vitamin D deficiency, unspecified: Secondary | ICD-10-CM

## 2020-03-29 DIAGNOSIS — Z1231 Encounter for screening mammogram for malignant neoplasm of breast: Secondary | ICD-10-CM

## 2020-03-29 DIAGNOSIS — Z79899 Other long term (current) drug therapy: Secondary | ICD-10-CM

## 2020-03-30 ENCOUNTER — Other Ambulatory Visit: Payer: Self-pay | Admitting: Internal Medicine

## 2020-03-30 DIAGNOSIS — R928 Other abnormal and inconclusive findings on diagnostic imaging of breast: Secondary | ICD-10-CM

## 2020-04-05 ENCOUNTER — Ambulatory Visit
Admission: RE | Admit: 2020-04-05 | Discharge: 2020-04-05 | Disposition: A | Discharge status: Home / Self Care | Payer: Medicare PPO | Source: Ambulatory Visit | Attending: Internal Medicine | Admitting: Internal Medicine

## 2020-04-05 ENCOUNTER — Other Ambulatory Visit: Payer: Self-pay

## 2020-04-05 DIAGNOSIS — R928 Other abnormal and inconclusive findings on diagnostic imaging of breast: Secondary | ICD-10-CM

## 2020-04-11 DIAGNOSIS — M179 Osteoarthritis of knee, unspecified: Secondary | ICD-10-CM | POA: Diagnosis not present

## 2020-04-11 DIAGNOSIS — E1165 Type 2 diabetes mellitus with hyperglycemia: Secondary | ICD-10-CM | POA: Diagnosis not present

## 2020-04-11 DIAGNOSIS — M1990 Unspecified osteoarthritis, unspecified site: Secondary | ICD-10-CM | POA: Diagnosis not present

## 2020-04-11 DIAGNOSIS — E782 Mixed hyperlipidemia: Secondary | ICD-10-CM | POA: Diagnosis not present

## 2020-04-11 DIAGNOSIS — I10 Essential (primary) hypertension: Secondary | ICD-10-CM | POA: Diagnosis not present

## 2020-04-11 DIAGNOSIS — E785 Hyperlipidemia, unspecified: Secondary | ICD-10-CM | POA: Diagnosis not present

## 2020-04-11 DIAGNOSIS — F329 Major depressive disorder, single episode, unspecified: Secondary | ICD-10-CM | POA: Diagnosis not present

## 2020-04-11 DIAGNOSIS — E1140 Type 2 diabetes mellitus with diabetic neuropathy, unspecified: Secondary | ICD-10-CM | POA: Diagnosis not present

## 2020-04-11 DIAGNOSIS — M189 Osteoarthritis of first carpometacarpal joint, unspecified: Secondary | ICD-10-CM | POA: Diagnosis not present

## 2020-04-12 DIAGNOSIS — H35372 Puckering of macula, left eye: Secondary | ICD-10-CM | POA: Diagnosis not present

## 2020-04-12 DIAGNOSIS — H2513 Age-related nuclear cataract, bilateral: Secondary | ICD-10-CM | POA: Diagnosis not present

## 2020-04-12 DIAGNOSIS — E113293 Type 2 diabetes mellitus with mild nonproliferative diabetic retinopathy without macular edema, bilateral: Secondary | ICD-10-CM | POA: Diagnosis not present

## 2020-04-28 ENCOUNTER — Other Ambulatory Visit: Payer: Self-pay | Admitting: Internal Medicine

## 2020-04-28 DIAGNOSIS — M5412 Radiculopathy, cervical region: Secondary | ICD-10-CM

## 2020-04-29 DIAGNOSIS — I10 Essential (primary) hypertension: Secondary | ICD-10-CM | POA: Diagnosis not present

## 2020-05-02 DIAGNOSIS — R21 Rash and other nonspecific skin eruption: Secondary | ICD-10-CM | POA: Diagnosis not present

## 2020-05-02 DIAGNOSIS — M2550 Pain in unspecified joint: Secondary | ICD-10-CM | POA: Diagnosis not present

## 2020-05-02 DIAGNOSIS — M791 Myalgia, unspecified site: Secondary | ICD-10-CM | POA: Diagnosis not present

## 2020-05-02 DIAGNOSIS — M064 Inflammatory polyarthropathy: Secondary | ICD-10-CM | POA: Diagnosis not present

## 2020-05-02 DIAGNOSIS — K219 Gastro-esophageal reflux disease without esophagitis: Secondary | ICD-10-CM | POA: Diagnosis not present

## 2020-05-02 DIAGNOSIS — Z853 Personal history of malignant neoplasm of breast: Secondary | ICD-10-CM | POA: Diagnosis not present

## 2020-05-02 DIAGNOSIS — E119 Type 2 diabetes mellitus without complications: Secondary | ICD-10-CM | POA: Diagnosis not present

## 2020-05-02 DIAGNOSIS — M1990 Unspecified osteoarthritis, unspecified site: Secondary | ICD-10-CM | POA: Diagnosis not present

## 2020-05-03 DIAGNOSIS — L400 Psoriasis vulgaris: Secondary | ICD-10-CM | POA: Diagnosis not present

## 2020-05-03 DIAGNOSIS — D485 Neoplasm of uncertain behavior of skin: Secondary | ICD-10-CM | POA: Diagnosis not present

## 2020-05-03 DIAGNOSIS — L280 Lichen simplex chronicus: Secondary | ICD-10-CM | POA: Diagnosis not present

## 2020-05-10 DIAGNOSIS — F4312 Post-traumatic stress disorder, chronic: Secondary | ICD-10-CM | POA: Diagnosis not present

## 2020-05-10 DIAGNOSIS — F411 Generalized anxiety disorder: Secondary | ICD-10-CM | POA: Diagnosis not present

## 2020-05-10 DIAGNOSIS — F331 Major depressive disorder, recurrent, moderate: Secondary | ICD-10-CM | POA: Diagnosis not present

## 2020-05-16 DIAGNOSIS — F320 Major depressive disorder, single episode, mild: Secondary | ICD-10-CM | POA: Diagnosis not present

## 2020-05-16 DIAGNOSIS — E782 Mixed hyperlipidemia: Secondary | ICD-10-CM | POA: Diagnosis not present

## 2020-05-16 DIAGNOSIS — F329 Major depressive disorder, single episode, unspecified: Secondary | ICD-10-CM | POA: Diagnosis not present

## 2020-05-16 DIAGNOSIS — F324 Major depressive disorder, single episode, in partial remission: Secondary | ICD-10-CM | POA: Diagnosis not present

## 2020-05-16 DIAGNOSIS — E1140 Type 2 diabetes mellitus with diabetic neuropathy, unspecified: Secondary | ICD-10-CM | POA: Diagnosis not present

## 2020-05-16 DIAGNOSIS — E1165 Type 2 diabetes mellitus with hyperglycemia: Secondary | ICD-10-CM | POA: Diagnosis not present

## 2020-05-16 DIAGNOSIS — F325 Major depressive disorder, single episode, in full remission: Secondary | ICD-10-CM | POA: Diagnosis not present

## 2020-05-16 DIAGNOSIS — I10 Essential (primary) hypertension: Secondary | ICD-10-CM | POA: Diagnosis not present

## 2020-05-16 DIAGNOSIS — E785 Hyperlipidemia, unspecified: Secondary | ICD-10-CM | POA: Diagnosis not present

## 2020-05-28 ENCOUNTER — Ambulatory Visit
Admission: RE | Admit: 2020-05-28 | Discharge: 2020-05-28 | Disposition: A | Discharge status: Home / Self Care | Payer: Medicare PPO | Source: Ambulatory Visit | Attending: Internal Medicine | Admitting: Internal Medicine

## 2020-05-28 DIAGNOSIS — M4802 Spinal stenosis, cervical region: Secondary | ICD-10-CM | POA: Diagnosis not present

## 2020-05-28 DIAGNOSIS — M5412 Radiculopathy, cervical region: Secondary | ICD-10-CM

## 2020-06-01 DIAGNOSIS — M4802 Spinal stenosis, cervical region: Secondary | ICD-10-CM | POA: Diagnosis not present

## 2020-06-01 DIAGNOSIS — M4712 Other spondylosis with myelopathy, cervical region: Secondary | ICD-10-CM | POA: Diagnosis not present

## 2020-06-01 DIAGNOSIS — G250 Essential tremor: Secondary | ICD-10-CM | POA: Diagnosis not present

## 2020-06-01 DIAGNOSIS — M5030 Other cervical disc degeneration, unspecified cervical region: Secondary | ICD-10-CM | POA: Diagnosis not present

## 2020-06-01 DIAGNOSIS — M5412 Radiculopathy, cervical region: Secondary | ICD-10-CM | POA: Diagnosis not present

## 2020-06-09 NOTE — Progress Notes (Deleted)
Assessment/Plan:   1.  Essential Tremor.  -This is evidenced by the symmetrical nature and longstanding hx of gradually getting worse.  We discussed nature and pathophysiology.  We discussed that this can continue to gradually get worse with time.  We discussed that some medications can worsen this, as can caffeine use.  We discussed medication therapy as well as surgical therapy.  Ultimately, the patient decided to ***.     Subjective:   [name] was seen in consultation in the movement disorder clinic at the request of Erline Levine, MD.  The evaluation is for tremor.  Tremor started approximately *** ago and involves the ***.  Tremor is most noticeable when ***.   There is *** family hx of tremor.    Affected by caffeine:  {yes no:314532} Affected by alcohol:  {yes no:314532} Affected by stress:  {yes no:314532} Affected by fatigue:  {yes no:314532} Spills soup if on spoon:  {yes no:314532} Spills glass of liquid if full:  {yes no:314532} Affects ADL's (tying shoes, brushing teeth, etc):  {yes no:314532}  Current/Previously tried tremor medications: ***  Current medications that may exacerbate tremor:  ***  Outside reports reviewed: {Outside review:15817}.  Allergies  Allergen Reactions  . Dilaudid [Hydromorphone Hcl] Itching  . Entex Lq [Phenylephrine-Guaifenesin] Hypertension  . Lipitor [Atorvastatin] Other (See Comments)    Leg pain  . Percocet [Oxycodone-Acetaminophen] Itching  . Protonix [Pantoprazole Sodium]   . Sulfa Antibiotics Nausea Only  . Morphine And Related Rash  . Vasotec [Enalapril] Palpitations  . Vicodin [Hydrocodone-Acetaminophen] Swelling and Rash    Current Outpatient Medications  Medication Instructions  . ALPRAZolam (XANAX) 1 mg, Daily at bedtime  . aspirin 81 mg, Oral, Daily  . calcium-vitamin D (OSCAL WITH D) 500-200 MG-UNIT per tablet 1 tablet  . cephALEXin (KEFLEX) 500 mg, 4 times daily  . cetirizine (ZYRTEC) 10 mg, Daily PRN    . Cholecalciferol (VITAMIN D3) 1000 UNITS CAPS 1 capsule, 2 times daily  . cyclobenzaprine (FLEXERIL) 10 mg, As needed  . diltiazem (DILACOR XR) 180 mg, Daily with breakfast  . furosemide (LASIX) 20 mg, Daily  . glipiZIDE (GLUCOTROL) 10 mg, Daily  . glucose blood test strip 1 each, BH-each morning  . glucose blood test strip 1 each, 2 times daily before meals  . hydrocortisone-pramoxine (ANALPRAM-HC) 2.5-1 % rectal cream APPLY TO ANUS AND SKIN TAGS THREE TIMES DAILY AS NEEDED FOR ITCHING, BURNING, AND SWELLING.  Marland Kitchen ibuprofen (ADVIL) 400 mg, As needed  . insulin glargine (LANTUS) 40 Units, Daily at bedtime  . Melatonin 20 mg, Every evening  . naproxen sodium (ALEVE) 220 mg, BH-q am and hs  . omeprazole (PRILOSEC) 20 mg, BH-q am and hs  . rosuvastatin (CRESTOR) 10 mg, Every other day  . telmisartan (MICARDIS) 80 mg, Every evening  . venlafaxine (EFFEXOR) 100 mg, 2 times daily     Objective:   VITALS:  There were no vitals filed for this visit. Gen:  Appears stated age and in NAD. HEENT:  Normocephalic, atraumatic. The mucous membranes are moist. The superficial temporal arteries are without ropiness or tenderness. Cardiovascular: Regular rate and rhythm. Lungs: Clear to auscultation bilaterally. Neck: There are no carotid bruits noted bilaterally.  NEUROLOGICAL:  Orientation:  The patient is alert and oriented x 3.   Cranial nerves: There is good facial symmetry. Extraocular muscles are intact and visual fields are full to confrontational testing. Speech is fluent and clear. Soft palate rises symmetrically and there is no tongue deviation. Hearing  is intact to conversational tone. Tone: Tone is good throughout. Sensation: Sensation is intact to light touch touch throughout (facial, trunk, extremities). Vibration is intact at the bilateral big toe. There is no extinction with double simultaneous stimulation. There is no sensory dermatomal level identified. Coordination:  The patient  has no dysdiadichokinesia or dysmetria. Motor: Strength is 5/5 in the bilateral upper and lower extremities.  Shoulder shrug is equal bilaterally.  There is no pronator drift.  There are no fasciculations noted. DTR's: Deep tendon reflexes are 2/4 at the bilateral biceps, triceps, brachioradialis, patella and achilles.  Plantar responses are downgoing bilaterally. Gait and Station: The patient is able to ambulate without difficulty. The patient is able to heel toe walk without any difficulty. The patient is able to ambulate in a tandem fashion. The patient is able to stand in the Romberg position.   MOVEMENT EXAM: Tremor:  There is *** tremor in the UE, noted most significantly with action.  The patient is *** able to draw Archimedes spirals without significant difficulty.  There is *** tremor at rest.  The patient is *** able to pour water from one glass to another without spilling it.  I have reviewed and interpreted the following labs independently   Chemistry      Component Value Date/Time   NA 142 02/07/2011 0420   K 3.4 (L) 02/07/2011 0420   CL 108 02/07/2011 0420   CO2 29 02/07/2011 0420   BUN 8 02/07/2011 0420   CREATININE 0.61 02/07/2011 0420      Component Value Date/Time   CALCIUM 8.8 02/07/2011 0420   ALKPHOS 58 03/23/2008 1050   AST 24 03/23/2008 1050   ALT 36 (H) 03/23/2008 1050   BILITOT 0.7 03/23/2008 1050      Lab Results  Component Value Date   WBC 7.2 02/06/2011   HGB 11.0 (L) 02/06/2011   HCT 33.3 (L) 02/06/2011   MCV 83.7 02/06/2011   PLT 266 02/06/2011   Lab Results  Component Value Date   TSH 0.806 02/06/2011      Total time spent on today's visit was ***60 minutes, including both face-to-face time and nonface-to-face time.  Time included that spent on review of records (prior notes available to me/labs/imaging if pertinent), discussing treatment and goals, answering patient's questions and coordinating care.  CC:  Josetta Huddle, MD

## 2020-06-13 ENCOUNTER — Ambulatory Visit: Payer: Medicare PPO | Admitting: Neurology

## 2020-06-16 DIAGNOSIS — R490 Dysphonia: Secondary | ICD-10-CM | POA: Diagnosis not present

## 2020-06-16 DIAGNOSIS — E1165 Type 2 diabetes mellitus with hyperglycemia: Secondary | ICD-10-CM | POA: Diagnosis not present

## 2020-06-16 DIAGNOSIS — I10 Essential (primary) hypertension: Secondary | ICD-10-CM | POA: Diagnosis not present

## 2020-06-16 DIAGNOSIS — Z7984 Long term (current) use of oral hypoglycemic drugs: Secondary | ICD-10-CM | POA: Diagnosis not present

## 2020-06-21 ENCOUNTER — Encounter: Payer: Self-pay | Admitting: Neurology

## 2020-06-22 DIAGNOSIS — F329 Major depressive disorder, single episode, unspecified: Secondary | ICD-10-CM | POA: Diagnosis not present

## 2020-06-22 DIAGNOSIS — I10 Essential (primary) hypertension: Secondary | ICD-10-CM | POA: Diagnosis not present

## 2020-06-22 DIAGNOSIS — E1140 Type 2 diabetes mellitus with diabetic neuropathy, unspecified: Secondary | ICD-10-CM | POA: Diagnosis not present

## 2020-06-22 DIAGNOSIS — F325 Major depressive disorder, single episode, in full remission: Secondary | ICD-10-CM | POA: Diagnosis not present

## 2020-06-22 DIAGNOSIS — E1165 Type 2 diabetes mellitus with hyperglycemia: Secondary | ICD-10-CM | POA: Diagnosis not present

## 2020-06-22 DIAGNOSIS — F320 Major depressive disorder, single episode, mild: Secondary | ICD-10-CM | POA: Diagnosis not present

## 2020-06-22 DIAGNOSIS — J45998 Other asthma: Secondary | ICD-10-CM | POA: Diagnosis not present

## 2020-06-22 DIAGNOSIS — E782 Mixed hyperlipidemia: Secondary | ICD-10-CM | POA: Diagnosis not present

## 2020-06-22 DIAGNOSIS — F324 Major depressive disorder, single episode, in partial remission: Secondary | ICD-10-CM | POA: Diagnosis not present

## 2020-06-27 ENCOUNTER — Other Ambulatory Visit: Payer: Self-pay

## 2020-06-27 DIAGNOSIS — M4712 Other spondylosis with myelopathy, cervical region: Secondary | ICD-10-CM

## 2020-06-27 NOTE — Progress Notes (Signed)
Assessment/Plan:   1.  Tremor.  -Likely essential, although enhanced physiologic cannot be ruled out.  Nonetheless, hers is fairly mild.  Discussed medication options.  Ultimately, the patient decided to hold off on that.  I think that is a reasonable choice.  She will let us know if things get worse, or if new neurologic issues arise.  Patient education provided.  -pt reports had lab work with Josetta Huddle, MD.  States that has had tsh and chem checked.  Will try to get a copy of labs.     Subjective:   [name] was seen in consultation in the movement disorder clinic at the request of Erline Levine, MD.  The evaluation is for tremor. Medical records have been reviewed. This patient is accompanied in the office by her spouse who supplements the history.Patient seeing Dr. Vertell Limber for right upper extremity paresthesias and neck pain/possible radiculopathy. During his examination he noted tremor and she was sent here for further evaluation.   This patient is accompanied in the office by her spouse who supplements the history.  Tremor started approximately 3-4 months ago and involves the bilateral UE/LE and she states that she didn't even realize she had it until her daugher in law pointed it out.  She doesn't know if it came on quick or slow.  She does notice it now.  Tremor is most noticeable when she is trying to relax.   There is no family hx of tremor.    Affected by caffeine:  No. Affected by alcohol:  Doesn't drink any Affected by stress:  No. Affected by fatigue:  No. Spills soup if on spoon:  No.  (she is R handed) Spills glass of liquid if full:  No. Affects ADL's (tying shoes, brushing teeth, etc):  No.  Current/Previously tried tremor medications: none  Current medications that may exacerbate tremor:  n/a  Outside reports reviewed: historical medical records, lab reports, office notes and referral letter/letters. No hx of neuroimaging of brain Patient had EMG done  yesterday by Dr. 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  No evidence of entrapment neuropathy.  Allergies  Allergen Reactions  . Vicodin [Hydrocodone-Acetaminophen] Itching    Facial swelling   . Beta Adrenergic Blockers Other (See Comments)    Intolerance of the past  . Dilaudid [Hydromorphone Hcl] Itching  . Entex Lq [Phenylephrine-Guaifenesin] Hypertension  . Hydromorphone Hcl Itching  . Lipitor [Atorvastatin] Other (See Comments)    Leg pain  . Percocet [Oxycodone-Acetaminophen] Itching  . Protonix [Pantoprazole Sodium]   . Sulfa Antibiotics Nausea Only  . Wellbutrin [Bupropion] Other (See Comments)    Depression worsened  . Erythromycin Other (See Comments)    Upset stomach  . Metformin And Related Diarrhea  . Morphine Rash  . Morphine And Related Rash  . Vasotec [Enalapril] Palpitations  . Vicodin [Hydrocodone-Acetaminophen] Swelling and Rash    Current Outpatient Medications  Medication Instructions  . acetaminophen (QC ACETAMINOPHEN 8HR ARTH PAIN) 1,300 mg, Oral, 2 times daily  . ALPRAZolam (XANAX) 1 mg, Daily at bedtime  . amitriptyline (ELAVIL) 25 MG tablet 2 tablets, Oral, Daily at bedtime  . ascorbic acid (VITAMIN C) 500 mg, Oral, Daily  . aspirin 81 mg, Oral, Daily  . Biotin 10000 MCG TABS 1 tablet, Oral, Daily  . calcium-vitamin D (OSCAL WITH D) 500-200 MG-UNIT per tablet 1 tablet  . cephALEXin (KEFLEX) 500 mg, Oral, Take 4 tabs 1 hour before dental appointments  .  cetirizine (ZYRTEC) 10 mg, Daily PRN  . Cholecalciferol (VITAMIN D3) 125 MCG (5000 UT) TABS 1 capsule, Oral, 2 times daily  . cyclobenzaprine (FLEXERIL) 10 mg, As needed  . diltiazem (CARDIZEM CD) 360 MG 24 hr capsule 1 capsule, Oral, Daily  . fluticasone (FLONASE) 50 MCG/ACT nasal spray 2 sprays, Each Nare, Daily  . glipiZIDE (GLUCOTROL) 10 mg, Daily  . glucose blood test strip 1 each, 2 times daily before meals  . hydrALAZINE  (APRESOLINE) 100 mg, Oral, 3 times daily  . hydrochlorothiazide (HYDRODIURIL) 25 mg, Oral, Daily  . ibuprofen (ADVIL) 200 mg, Oral, As needed  . insulin glargine (LANTUS) 85 Units, Subcutaneous, Daily at bedtime  . JARDIANCE 25 MG TABS tablet 1 tablet, Oral, Daily  . omeprazole (PRILOSEC) 20 mg, BH-q am and hs  . rosuvastatin (CRESTOR) 5 MG tablet 1 tablet, Oral, 3 times weekly  . venlafaxine XR (EFFEXOR-XR) 150 mg, Oral, 2 times daily     Objective:   VITALS:   Vitals:   06/29/20 1010  BP: (!) 148/66  Pulse: 80  SpO2: 96%  Weight: 192 lb (87.1 kg)  Height: 5\' 4"  (1.626 m)   Gen:  Appears stated age and in NAD. HEENT:  Normocephalic, atraumatic. The mucous membranes are moist. The superficial temporal arteries are without ropiness or tenderness. Cardiovascular: Regular rate and rhythm. Lungs: Clear to auscultation bilaterally. Neck: There are no carotid bruits noted bilaterally.  NEUROLOGICAL:  Orientation:  The patient is alert and oriented x 3.   Cranial nerves: There is good facial symmetry. Extraocular muscles are intact and visual fields are full to confrontational testing. Speech is fluent and clear. Soft palate rises symmetrically and there is no tongue deviation. Hearing is intact to conversational tone. Tone: Tone is good throughout. Sensation: Sensation is intact to light touch touch throughout (facial, trunk, extremities). Vibration is intact at the bilateral big toe. There is no extinction with double simultaneous stimulation. There is no sensory dermatomal level identified. Coordination:  The patient has no dysdiadichokinesia or dysmetria. Motor: Strength is 5/5 in the bilateral upper and lower extremities.  Shoulder shrug is equal bilaterally.  There is no pronator drift.  There are no fasciculations noted. DTR's: Deep tendon reflexes are 2/4 at the bilateral biceps, triceps, brachioradialis, 2+ at the bilateral patella and achilles.  Plantar responses are downgoing  bilaterally. Gait and Station: The patient is able to ambulate without difficulty. The patient is able to heel toe walk without any difficulty. The patient is able to ambulate in a tandem fashion. The patient is able to stand in the Romberg position.   MOVEMENT EXAM: Tremor:  There is no rest tremor.  There is no postural tremor.  Mild intention tremor bilaterally.  Some rare tremor in the legs bilaterally.  Mild trouble with  Archimedes spirals on the R.  The patient is  able to pour water from one glass to another without spilling it but she is tremulous when picking up the glass.    Total time spent on today's visit was 45 minutes, including both face-to-face time and nonface-to-face time.  Time included that spent on review of records (prior notes available to me/labs/imaging if pertinent), discussing treatment and goals, answering patient's questions and coordinating care.  CC:  Josetta Huddle, MD

## 2020-06-28 ENCOUNTER — Ambulatory Visit (INDEPENDENT_AMBULATORY_CARE_PROVIDER_SITE_OTHER): Payer: Medicare PPO | Admitting: Neurology

## 2020-06-28 ENCOUNTER — Other Ambulatory Visit: Payer: Self-pay

## 2020-06-28 DIAGNOSIS — M5412 Radiculopathy, cervical region: Secondary | ICD-10-CM

## 2020-06-28 DIAGNOSIS — M4712 Other spondylosis with myelopathy, cervical region: Secondary | ICD-10-CM

## 2020-06-28 NOTE — Procedures (Signed)
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  NCV & EMG Findings: Extensive electrodiagnostic testing of the right upper extremity and additional studies of the left shows:  1. Bilateral median, ulnar, and mixed palmar sensory responses are within normal limits. 2. Bilateral median and right ulnar motor responses are within normal limits.  Left ulnar motor response shows reduced amplitude (6.6 mV.   3. 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  Impression: 1. 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 2. There is no evidence of carpal tunnel syndrome or ulnar neuropathy affecting either upper extremity.   ___________________________ Narda Amber, DO    Nerve Conduction Studies Anti Sensory Summary Table   Stim Site NR Peak (ms) Norm Peak (ms) P-T Amp (V) Norm P-T Amp  Left Median Anti Sensory (2nd Digit)  34C  Wrist    3.1 <3.8 26.5 >10  Right Median Anti Sensory (2nd Digit)  34C  Wrist    2.8 <3.8 24.8 >10  Left Ulnar Anti Sensory (5th Digit)  34C  Wrist    3.1 <3.2 20.0 >5  Right Ulnar Anti Sensory (5th Digit)  34C  Wrist    2.5 <3.2 25.7 >5   Motor Summary Table   Stim Site NR Onset (ms) Norm Onset (ms) O-P Amp (mV) Norm O-P Amp Site1 Site2 Delta-0 (ms) Dist (cm) Vel (m/s) Norm Vel (m/s)  Left Median Motor (Abd Poll Brev)  34C  Wrist    3.3 <4.0 7.7 >5 Elbow Wrist 4.5 25.0 56 >50  Elbow    7.8  7.0         Right Median Motor (Abd  Poll Brev)  34C  Wrist    2.7 <4.0 10.7 >5 Elbow Wrist 4.1 24.0 59 >50  Elbow    6.8  9.9         Left Ulnar Motor (Abd Dig Minimi)  34C  Wrist    2.8 <3.1 6.6 >7 B Elbow Wrist 3.7 21.0 57 >50  B Elbow    6.5  5.8  A Elbow B Elbow 2.2 10.0 51 >50  A Elbow    8.7  4.7         Right Ulnar Motor (Abd Dig Minimi)  34C  Wrist    2.7 <3.1 8.9 >7 B Elbow Wrist 3.2 19.0 59 >50  B Elbow    5.9  8.2  A Elbow B Elbow 1.8 10.0 56 >50  A Elbow    7.7  7.8          Comparison Summary Table   Stim Site NR Peak (ms) Norm Peak (ms) P-T Amp (V) Site1 Site2 Delta-P (ms) Norm Delta (ms)  Left Median/Ulnar Palm Comparison (Wrist - 8cm)  34C  Median Palm    1.7 <2.2 67.7 Median Palm Ulnar Palm 0.1   Ulnar Palm    1.8 <2.2 11.9      Right Median/Ulnar Palm Comparison (Wrist - 8cm)  34C  Median Palm    1.8 <  2.2 43.7 Median Palm Ulnar Palm 0.1   Ulnar Palm    1.7 <2.2 18.4       EMG   Side Muscle Ins Act Fibs Psw Fasc Number Recrt Dur Dur. Amp Amp. Poly Poly. Comment  Right 1stDorInt Nml Nml Nml Nml Nml Nml Nml Nml Nml Nml Nml Nml N/A  Right PronatorTeres Nml Nml Nml Nml Nml Nml Nml Nml Nml Nml Nml Nml N/A  Right Biceps Nml Nml Nml Nml Nml Nml Nml Nml Nml Nml Nml Nml N/A  Right Triceps Nml Nml Nml Nml Nml Nml Nml Nml Nml Nml Nml Nml N/A  Right Deltoid Nml Nml Nml Nml Nml Nml Nml Nml Nml Nml Nml Nml N/A  Left 1stDorInt Nml Nml Nml Nml 1- Rapid Some 1+ Some 1+ Nml Nml N/A  Left PronatorTeres Nml Nml Nml Nml 1- Rapid Some 1+ Some 1+ Nml Nml N/A  Left Biceps Nml Nml Nml Nml Nml Nml Nml Nml Nml Nml Nml Nml N/A  Left Triceps Nml Nml Nml Nml 1- Rapid Some 1+ Some 1+ Nml Nml N/A  Left Deltoid Nml Nml Nml Nml 1- Rapid Some 1+ Some 1+ Nml Nml N/A      Waveforms:

## 2020-06-29 ENCOUNTER — Ambulatory Visit: Payer: Medicare PPO | Admitting: Neurology

## 2020-06-29 ENCOUNTER — Encounter: Payer: Self-pay | Admitting: Neurology

## 2020-06-29 VITALS — BP 148/66 | HR 80 | Ht 64.0 in | Wt 192.0 lb

## 2020-06-29 DIAGNOSIS — G250 Essential tremor: Secondary | ICD-10-CM

## 2020-06-29 NOTE — Patient Instructions (Signed)
The physicians and staff at [facility] are committed to providing excellent care. You may receive a survey requesting feedback about your experience at our office. We strive to receive "very good" responses to the survey questions. If you feel that your experience would prevent you from giving the office a "very good " response, please contact our office to try to remedy the situation. We may be reached at [phone]. Thank you for taking the time out of your busy day to complete the survey.   Essential Tremor A tremor is trembling or shaking that a person cannot control. Most tremors affect the hands or arms. Tremors can also affect the head, vocal cords, legs, and other parts of the body. Essential tremor is a tremor without a known cause. Usually, it occurs while a person is trying to perform an action. It tends to get worse gradually as a person ages. What are the causes? The cause of this condition is not known. What increases the risk? You are more likely to develop this condition if:  You have a family member with essential tremor.  You are age 92 or older.  You take certain medicines. What are the signs or symptoms? The main sign of a tremor is a rhythmic shaking of certain parts of your body that is uncontrolled and unintentional. You may:  Have difficulty eating with a spoon or fork.  Have difficulty writing.  Nod your head up and down or side to side.  Have a quivering voice. The shaking may:  Get worse over time.  Come and go.  Be more noticeable on one side of your body.  Get worse due to stress, fatigue, caffeine, and extreme heat or cold. How is this diagnosed? This condition may be diagnosed based on:  Your symptoms and medical history.  A physical exam. There is no single test to diagnose an essential tremor. However, your health care provider may order tests to rule out other causes of your condition. These may include:  Blood and urine  tests.  Imaging studies of your brain, such as CT scan and MRI.  A test that measures involuntary muscle movement (electromyogram). How is this treated? Treatment for essential tremor depends on the severity of the condition.  Some tremors may go away without treatment.  Mild tremors may not need treatment if they do not affect your day-to-day life.  Severe tremors may need to be treated using one or more of the following options: ? Medicines. ? Lifestyle changes. ? Occupational or physical therapy. Follow these instructions at home: Lifestyle   Do not use any products that contain nicotine or tobacco, such as cigarettes and e-cigarettes. If you need help quitting, ask your health care provider.  Limit your caffeine intake as told by your health care provider.  Try to get 8 hours of sleep each night.  Find ways to manage your stress that fits your lifestyle and personality. Consider trying meditation or yoga.  Try to anticipate stressful situations and allow extra time to manage them.  If you are struggling emotionally with the effects of your tremor, consider working with a mental health provider. General instructions  Take over-the-counter and prescription medicines only as told by your health care provider.  Avoid extreme heat and extreme cold.  Keep all follow-up visits as told by your health care provider. This is important. Visits may include physical therapy visits. Contact a health care provider if:  You experience any changes in the location or intensity of  your tremors.  You start having a tremor after starting a new medicine.  You have tremor with other symptoms, such as: ? Numbness. ? Tingling. ? Pain. ? Weakness.  Your tremor gets worse.  Your tremor interferes with your daily life.  You feel down, blue, or sad for at least 2 weeks in a row.  Worrying about your tremor and what other people think about you interferes with your everyday life  functions, including relationships, work, or school. Summary  Essential tremor is a tremor without a known cause. Usually, it occurs when you are trying to perform an action.  The cause of this condition is not known.  The main sign of a tremor is a rhythmic shaking of certain parts of your body that is uncontrolled and unintentional.  Treatment for essential tremor depends on the severity of the condition. This information is not intended to replace advice given to you by your health care provider. Make sure you discuss any questions you have with your health care provider. Document Revised: 11/22/2017 Document Reviewed: 11/22/2017 Elsevier Patient Education  2020 [employer].

## 2020-07-21 DIAGNOSIS — M189 Osteoarthritis of first carpometacarpal joint, unspecified: Secondary | ICD-10-CM | POA: Diagnosis not present

## 2020-07-21 DIAGNOSIS — E785 Hyperlipidemia, unspecified: Secondary | ICD-10-CM | POA: Diagnosis not present

## 2020-07-21 DIAGNOSIS — K219 Gastro-esophageal reflux disease without esophagitis: Secondary | ICD-10-CM | POA: Diagnosis not present

## 2020-07-21 DIAGNOSIS — E1165 Type 2 diabetes mellitus with hyperglycemia: Secondary | ICD-10-CM | POA: Diagnosis not present

## 2020-07-21 DIAGNOSIS — F3342 Major depressive disorder, recurrent, in full remission: Secondary | ICD-10-CM | POA: Diagnosis not present

## 2020-07-21 DIAGNOSIS — Z7984 Long term (current) use of oral hypoglycemic drugs: Secondary | ICD-10-CM | POA: Diagnosis not present

## 2020-07-21 DIAGNOSIS — I10 Essential (primary) hypertension: Secondary | ICD-10-CM | POA: Diagnosis not present

## 2020-07-21 DIAGNOSIS — R490 Dysphonia: Secondary | ICD-10-CM | POA: Diagnosis not present

## 2020-08-08 DIAGNOSIS — F320 Major depressive disorder, single episode, mild: Secondary | ICD-10-CM | POA: Diagnosis not present

## 2020-08-08 DIAGNOSIS — E782 Mixed hyperlipidemia: Secondary | ICD-10-CM | POA: Diagnosis not present

## 2020-08-08 DIAGNOSIS — E1165 Type 2 diabetes mellitus with hyperglycemia: Secondary | ICD-10-CM | POA: Diagnosis not present

## 2020-08-08 DIAGNOSIS — F329 Major depressive disorder, single episode, unspecified: Secondary | ICD-10-CM | POA: Diagnosis not present

## 2020-08-08 DIAGNOSIS — F325 Major depressive disorder, single episode, in full remission: Secondary | ICD-10-CM | POA: Diagnosis not present

## 2020-08-08 DIAGNOSIS — F324 Major depressive disorder, single episode, in partial remission: Secondary | ICD-10-CM | POA: Diagnosis not present

## 2020-08-08 DIAGNOSIS — E1140 Type 2 diabetes mellitus with diabetic neuropathy, unspecified: Secondary | ICD-10-CM | POA: Diagnosis not present

## 2020-08-08 DIAGNOSIS — E785 Hyperlipidemia, unspecified: Secondary | ICD-10-CM | POA: Diagnosis not present

## 2020-08-08 DIAGNOSIS — I10 Essential (primary) hypertension: Secondary | ICD-10-CM | POA: Diagnosis not present

## 2020-08-15 DIAGNOSIS — G250 Essential tremor: Secondary | ICD-10-CM | POA: Diagnosis not present

## 2020-08-15 DIAGNOSIS — M4712 Other spondylosis with myelopathy, cervical region: Secondary | ICD-10-CM | POA: Diagnosis not present

## 2020-08-15 DIAGNOSIS — M5030 Other cervical disc degeneration, unspecified cervical region: Secondary | ICD-10-CM | POA: Diagnosis not present

## 2020-08-15 DIAGNOSIS — M4802 Spinal stenosis, cervical region: Secondary | ICD-10-CM | POA: Diagnosis not present

## 2020-08-31 DIAGNOSIS — E782 Mixed hyperlipidemia: Secondary | ICD-10-CM | POA: Diagnosis not present

## 2020-08-31 DIAGNOSIS — M179 Osteoarthritis of knee, unspecified: Secondary | ICD-10-CM | POA: Diagnosis not present

## 2020-08-31 DIAGNOSIS — E1140 Type 2 diabetes mellitus with diabetic neuropathy, unspecified: Secondary | ICD-10-CM | POA: Diagnosis not present

## 2020-08-31 DIAGNOSIS — I10 Essential (primary) hypertension: Secondary | ICD-10-CM | POA: Diagnosis not present

## 2020-08-31 DIAGNOSIS — F329 Major depressive disorder, single episode, unspecified: Secondary | ICD-10-CM | POA: Diagnosis not present

## 2020-08-31 DIAGNOSIS — F320 Major depressive disorder, single episode, mild: Secondary | ICD-10-CM | POA: Diagnosis not present

## 2020-08-31 DIAGNOSIS — E1165 Type 2 diabetes mellitus with hyperglycemia: Secondary | ICD-10-CM | POA: Diagnosis not present

## 2020-08-31 DIAGNOSIS — J45998 Other asthma: Secondary | ICD-10-CM | POA: Diagnosis not present

## 2020-08-31 DIAGNOSIS — E785 Hyperlipidemia, unspecified: Secondary | ICD-10-CM | POA: Diagnosis not present

## 2020-09-27 DIAGNOSIS — E785 Hyperlipidemia, unspecified: Secondary | ICD-10-CM | POA: Diagnosis not present

## 2020-09-27 DIAGNOSIS — M189 Osteoarthritis of first carpometacarpal joint, unspecified: Secondary | ICD-10-CM | POA: Diagnosis not present

## 2020-09-27 DIAGNOSIS — J45998 Other asthma: Secondary | ICD-10-CM | POA: Diagnosis not present

## 2020-09-27 DIAGNOSIS — I10 Essential (primary) hypertension: Secondary | ICD-10-CM | POA: Diagnosis not present

## 2020-09-27 DIAGNOSIS — E1140 Type 2 diabetes mellitus with diabetic neuropathy, unspecified: Secondary | ICD-10-CM | POA: Diagnosis not present

## 2020-09-27 DIAGNOSIS — F329 Major depressive disorder, single episode, unspecified: Secondary | ICD-10-CM | POA: Diagnosis not present

## 2020-09-27 DIAGNOSIS — M1990 Unspecified osteoarthritis, unspecified site: Secondary | ICD-10-CM | POA: Diagnosis not present

## 2020-09-27 DIAGNOSIS — E782 Mixed hyperlipidemia: Secondary | ICD-10-CM | POA: Diagnosis not present

## 2020-09-27 DIAGNOSIS — G4700 Insomnia, unspecified: Secondary | ICD-10-CM | POA: Diagnosis not present

## 2020-10-19 DIAGNOSIS — M189 Osteoarthritis of first carpometacarpal joint, unspecified: Secondary | ICD-10-CM | POA: Diagnosis not present

## 2020-10-19 DIAGNOSIS — E11319 Type 2 diabetes mellitus with unspecified diabetic retinopathy without macular edema: Secondary | ICD-10-CM | POA: Diagnosis not present

## 2020-10-19 DIAGNOSIS — K219 Gastro-esophageal reflux disease without esophagitis: Secondary | ICD-10-CM | POA: Diagnosis not present

## 2020-10-19 DIAGNOSIS — E1165 Type 2 diabetes mellitus with hyperglycemia: Secondary | ICD-10-CM | POA: Diagnosis not present

## 2020-10-19 DIAGNOSIS — I10 Essential (primary) hypertension: Secondary | ICD-10-CM | POA: Diagnosis not present

## 2020-10-19 DIAGNOSIS — E782 Mixed hyperlipidemia: Secondary | ICD-10-CM | POA: Diagnosis not present

## 2020-10-19 DIAGNOSIS — E1140 Type 2 diabetes mellitus with diabetic neuropathy, unspecified: Secondary | ICD-10-CM | POA: Diagnosis not present

## 2020-10-19 DIAGNOSIS — F320 Major depressive disorder, single episode, mild: Secondary | ICD-10-CM | POA: Diagnosis not present

## 2020-10-19 DIAGNOSIS — R490 Dysphonia: Secondary | ICD-10-CM | POA: Diagnosis not present

## 2020-11-16 DIAGNOSIS — J45998 Other asthma: Secondary | ICD-10-CM | POA: Diagnosis not present

## 2020-11-16 DIAGNOSIS — E1165 Type 2 diabetes mellitus with hyperglycemia: Secondary | ICD-10-CM | POA: Diagnosis not present

## 2020-11-16 DIAGNOSIS — E11319 Type 2 diabetes mellitus with unspecified diabetic retinopathy without macular edema: Secondary | ICD-10-CM | POA: Diagnosis not present

## 2020-11-16 DIAGNOSIS — G4700 Insomnia, unspecified: Secondary | ICD-10-CM | POA: Diagnosis not present

## 2020-11-16 DIAGNOSIS — I10 Essential (primary) hypertension: Secondary | ICD-10-CM | POA: Diagnosis not present

## 2020-11-16 DIAGNOSIS — F320 Major depressive disorder, single episode, mild: Secondary | ICD-10-CM | POA: Diagnosis not present

## 2020-11-16 DIAGNOSIS — E1140 Type 2 diabetes mellitus with diabetic neuropathy, unspecified: Secondary | ICD-10-CM | POA: Diagnosis not present

## 2020-11-16 DIAGNOSIS — E782 Mixed hyperlipidemia: Secondary | ICD-10-CM | POA: Diagnosis not present

## 2020-11-28 DIAGNOSIS — M064 Inflammatory polyarthropathy: Secondary | ICD-10-CM | POA: Diagnosis not present

## 2020-11-28 DIAGNOSIS — M791 Myalgia, unspecified site: Secondary | ICD-10-CM | POA: Diagnosis not present

## 2020-11-28 DIAGNOSIS — M2550 Pain in unspecified joint: Secondary | ICD-10-CM | POA: Diagnosis not present

## 2020-11-28 DIAGNOSIS — M1990 Unspecified osteoarthritis, unspecified site: Secondary | ICD-10-CM | POA: Diagnosis not present

## 2020-11-28 DIAGNOSIS — E119 Type 2 diabetes mellitus without complications: Secondary | ICD-10-CM | POA: Diagnosis not present

## 2020-11-28 DIAGNOSIS — K219 Gastro-esophageal reflux disease without esophagitis: Secondary | ICD-10-CM | POA: Diagnosis not present

## 2020-11-28 DIAGNOSIS — M6530 Trigger finger, unspecified finger: Secondary | ICD-10-CM | POA: Diagnosis not present

## 2020-11-28 DIAGNOSIS — Z853 Personal history of malignant neoplasm of breast: Secondary | ICD-10-CM | POA: Diagnosis not present

## 2020-12-23 DIAGNOSIS — I10 Essential (primary) hypertension: Secondary | ICD-10-CM | POA: Diagnosis not present

## 2020-12-23 DIAGNOSIS — M1990 Unspecified osteoarthritis, unspecified site: Secondary | ICD-10-CM | POA: Diagnosis not present

## 2020-12-23 DIAGNOSIS — F320 Major depressive disorder, single episode, mild: Secondary | ICD-10-CM | POA: Diagnosis not present

## 2020-12-23 DIAGNOSIS — G4700 Insomnia, unspecified: Secondary | ICD-10-CM | POA: Diagnosis not present

## 2020-12-23 DIAGNOSIS — K219 Gastro-esophageal reflux disease without esophagitis: Secondary | ICD-10-CM | POA: Diagnosis not present

## 2020-12-23 DIAGNOSIS — E1165 Type 2 diabetes mellitus with hyperglycemia: Secondary | ICD-10-CM | POA: Diagnosis not present

## 2020-12-23 DIAGNOSIS — M67862 Other specified disorders of synovium, left knee: Secondary | ICD-10-CM | POA: Diagnosis not present

## 2020-12-23 DIAGNOSIS — M25562 Pain in left knee: Secondary | ICD-10-CM | POA: Insufficient documentation

## 2020-12-23 DIAGNOSIS — E782 Mixed hyperlipidemia: Secondary | ICD-10-CM | POA: Diagnosis not present

## 2020-12-23 DIAGNOSIS — E1140 Type 2 diabetes mellitus with diabetic neuropathy, unspecified: Secondary | ICD-10-CM | POA: Diagnosis not present

## 2020-12-23 DIAGNOSIS — J45998 Other asthma: Secondary | ICD-10-CM | POA: Diagnosis not present

## 2021-01-04 DIAGNOSIS — E113311 Type 2 diabetes mellitus with moderate nonproliferative diabetic retinopathy with macular edema, right eye: Secondary | ICD-10-CM | POA: Diagnosis not present

## 2021-01-04 DIAGNOSIS — H524 Presbyopia: Secondary | ICD-10-CM | POA: Diagnosis not present

## 2021-01-04 DIAGNOSIS — H40053 Ocular hypertension, bilateral: Secondary | ICD-10-CM | POA: Diagnosis not present

## 2021-01-04 DIAGNOSIS — H2513 Age-related nuclear cataract, bilateral: Secondary | ICD-10-CM | POA: Diagnosis not present

## 2021-02-22 DIAGNOSIS — E782 Mixed hyperlipidemia: Secondary | ICD-10-CM | POA: Diagnosis not present

## 2021-02-22 DIAGNOSIS — K219 Gastro-esophageal reflux disease without esophagitis: Secondary | ICD-10-CM | POA: Diagnosis not present

## 2021-02-22 DIAGNOSIS — F320 Major depressive disorder, single episode, mild: Secondary | ICD-10-CM | POA: Diagnosis not present

## 2021-02-22 DIAGNOSIS — E1165 Type 2 diabetes mellitus with hyperglycemia: Secondary | ICD-10-CM | POA: Diagnosis not present

## 2021-02-22 DIAGNOSIS — E1140 Type 2 diabetes mellitus with diabetic neuropathy, unspecified: Secondary | ICD-10-CM | POA: Diagnosis not present

## 2021-02-22 DIAGNOSIS — G4700 Insomnia, unspecified: Secondary | ICD-10-CM | POA: Diagnosis not present

## 2021-02-22 DIAGNOSIS — I10 Essential (primary) hypertension: Secondary | ICD-10-CM | POA: Diagnosis not present

## 2021-02-22 DIAGNOSIS — J45998 Other asthma: Secondary | ICD-10-CM | POA: Diagnosis not present

## 2021-02-22 DIAGNOSIS — M1990 Unspecified osteoarthritis, unspecified site: Secondary | ICD-10-CM | POA: Diagnosis not present

## 2021-03-08 DIAGNOSIS — E113313 Type 2 diabetes mellitus with moderate nonproliferative diabetic retinopathy with macular edema, bilateral: Secondary | ICD-10-CM | POA: Diagnosis not present

## 2021-03-10 ENCOUNTER — Other Ambulatory Visit: Payer: Self-pay | Admitting: Internal Medicine

## 2021-03-10 DIAGNOSIS — Z1231 Encounter for screening mammogram for malignant neoplasm of breast: Secondary | ICD-10-CM

## 2021-03-17 DIAGNOSIS — E782 Mixed hyperlipidemia: Secondary | ICD-10-CM | POA: Diagnosis not present

## 2021-03-17 DIAGNOSIS — E1140 Type 2 diabetes mellitus with diabetic neuropathy, unspecified: Secondary | ICD-10-CM | POA: Diagnosis not present

## 2021-03-17 DIAGNOSIS — I10 Essential (primary) hypertension: Secondary | ICD-10-CM | POA: Diagnosis not present

## 2021-03-17 DIAGNOSIS — F3342 Major depressive disorder, recurrent, in full remission: Secondary | ICD-10-CM | POA: Diagnosis not present

## 2021-03-17 DIAGNOSIS — Z0001 Encounter for general adult medical examination with abnormal findings: Secondary | ICD-10-CM | POA: Diagnosis not present

## 2021-03-17 DIAGNOSIS — E1165 Type 2 diabetes mellitus with hyperglycemia: Secondary | ICD-10-CM | POA: Diagnosis not present

## 2021-03-17 DIAGNOSIS — Z7984 Long term (current) use of oral hypoglycemic drugs: Secondary | ICD-10-CM | POA: Diagnosis not present

## 2021-03-17 DIAGNOSIS — E559 Vitamin D deficiency, unspecified: Secondary | ICD-10-CM | POA: Diagnosis not present

## 2021-03-17 DIAGNOSIS — E11319 Type 2 diabetes mellitus with unspecified diabetic retinopathy without macular edema: Secondary | ICD-10-CM | POA: Diagnosis not present

## 2021-03-17 DIAGNOSIS — Z79899 Other long term (current) drug therapy: Secondary | ICD-10-CM | POA: Diagnosis not present

## 2021-03-20 DIAGNOSIS — K219 Gastro-esophageal reflux disease without esophagitis: Secondary | ICD-10-CM | POA: Diagnosis not present

## 2021-03-20 DIAGNOSIS — F329 Major depressive disorder, single episode, unspecified: Secondary | ICD-10-CM | POA: Diagnosis not present

## 2021-03-20 DIAGNOSIS — I10 Essential (primary) hypertension: Secondary | ICD-10-CM | POA: Diagnosis not present

## 2021-03-20 DIAGNOSIS — E1165 Type 2 diabetes mellitus with hyperglycemia: Secondary | ICD-10-CM | POA: Diagnosis not present

## 2021-03-20 DIAGNOSIS — J45998 Other asthma: Secondary | ICD-10-CM | POA: Diagnosis not present

## 2021-03-20 DIAGNOSIS — E782 Mixed hyperlipidemia: Secondary | ICD-10-CM | POA: Diagnosis not present

## 2021-03-20 DIAGNOSIS — E1140 Type 2 diabetes mellitus with diabetic neuropathy, unspecified: Secondary | ICD-10-CM | POA: Diagnosis not present

## 2021-03-20 DIAGNOSIS — M179 Osteoarthritis of knee, unspecified: Secondary | ICD-10-CM | POA: Diagnosis not present

## 2021-03-20 DIAGNOSIS — G4700 Insomnia, unspecified: Secondary | ICD-10-CM | POA: Diagnosis not present

## 2021-03-24 DIAGNOSIS — H2513 Age-related nuclear cataract, bilateral: Secondary | ICD-10-CM | POA: Diagnosis not present

## 2021-03-24 DIAGNOSIS — H35372 Puckering of macula, left eye: Secondary | ICD-10-CM | POA: Diagnosis not present

## 2021-03-24 DIAGNOSIS — E113311 Type 2 diabetes mellitus with moderate nonproliferative diabetic retinopathy with macular edema, right eye: Secondary | ICD-10-CM | POA: Diagnosis not present

## 2021-03-24 DIAGNOSIS — E113412 Type 2 diabetes mellitus with severe nonproliferative diabetic retinopathy with macular edema, left eye: Secondary | ICD-10-CM | POA: Diagnosis not present

## 2021-03-29 DIAGNOSIS — E113311 Type 2 diabetes mellitus with moderate nonproliferative diabetic retinopathy with macular edema, right eye: Secondary | ICD-10-CM | POA: Diagnosis not present

## 2021-03-29 DIAGNOSIS — E113412 Type 2 diabetes mellitus with severe nonproliferative diabetic retinopathy with macular edema, left eye: Secondary | ICD-10-CM | POA: Diagnosis not present

## 2021-04-21 DIAGNOSIS — G4700 Insomnia, unspecified: Secondary | ICD-10-CM | POA: Diagnosis not present

## 2021-04-21 DIAGNOSIS — E1165 Type 2 diabetes mellitus with hyperglycemia: Secondary | ICD-10-CM | POA: Diagnosis not present

## 2021-04-21 DIAGNOSIS — M1990 Unspecified osteoarthritis, unspecified site: Secondary | ICD-10-CM | POA: Diagnosis not present

## 2021-04-21 DIAGNOSIS — E782 Mixed hyperlipidemia: Secondary | ICD-10-CM | POA: Diagnosis not present

## 2021-04-21 DIAGNOSIS — K219 Gastro-esophageal reflux disease without esophagitis: Secondary | ICD-10-CM | POA: Diagnosis not present

## 2021-04-21 DIAGNOSIS — I10 Essential (primary) hypertension: Secondary | ICD-10-CM | POA: Diagnosis not present

## 2021-04-21 DIAGNOSIS — J45998 Other asthma: Secondary | ICD-10-CM | POA: Diagnosis not present

## 2021-04-21 DIAGNOSIS — E1140 Type 2 diabetes mellitus with diabetic neuropathy, unspecified: Secondary | ICD-10-CM | POA: Diagnosis not present

## 2021-04-21 DIAGNOSIS — F329 Major depressive disorder, single episode, unspecified: Secondary | ICD-10-CM | POA: Diagnosis not present

## 2021-05-01 ENCOUNTER — Ambulatory Visit
Admission: RE | Admit: 2021-05-01 | Discharge: 2021-05-01 | Disposition: A | Discharge status: Home / Self Care | Payer: Medicare PPO | Source: Ambulatory Visit | Attending: Internal Medicine | Admitting: Internal Medicine

## 2021-05-01 ENCOUNTER — Other Ambulatory Visit: Payer: Self-pay

## 2021-05-01 DIAGNOSIS — Z1231 Encounter for screening mammogram for malignant neoplasm of breast: Secondary | ICD-10-CM

## 2021-05-25 DIAGNOSIS — F3342 Major depressive disorder, recurrent, in full remission: Secondary | ICD-10-CM | POA: Diagnosis not present

## 2021-05-25 DIAGNOSIS — E08319 Diabetes mellitus due to underlying condition with unspecified diabetic retinopathy without macular edema: Secondary | ICD-10-CM | POA: Diagnosis not present

## 2021-05-25 DIAGNOSIS — I10 Essential (primary) hypertension: Secondary | ICD-10-CM | POA: Diagnosis not present

## 2021-05-25 DIAGNOSIS — J45998 Other asthma: Secondary | ICD-10-CM | POA: Diagnosis not present

## 2021-05-25 DIAGNOSIS — E1140 Type 2 diabetes mellitus with diabetic neuropathy, unspecified: Secondary | ICD-10-CM | POA: Diagnosis not present

## 2021-05-25 DIAGNOSIS — E782 Mixed hyperlipidemia: Secondary | ICD-10-CM | POA: Diagnosis not present

## 2021-05-25 DIAGNOSIS — E1165 Type 2 diabetes mellitus with hyperglycemia: Secondary | ICD-10-CM | POA: Diagnosis not present

## 2021-05-25 DIAGNOSIS — K219 Gastro-esophageal reflux disease without esophagitis: Secondary | ICD-10-CM | POA: Diagnosis not present

## 2021-05-25 DIAGNOSIS — G4700 Insomnia, unspecified: Secondary | ICD-10-CM | POA: Diagnosis not present

## 2021-06-01 DIAGNOSIS — E785 Hyperlipidemia, unspecified: Secondary | ICD-10-CM | POA: Diagnosis not present

## 2021-06-01 DIAGNOSIS — E1140 Type 2 diabetes mellitus with diabetic neuropathy, unspecified: Secondary | ICD-10-CM | POA: Diagnosis not present

## 2021-06-01 DIAGNOSIS — K219 Gastro-esophageal reflux disease without esophagitis: Secondary | ICD-10-CM | POA: Diagnosis not present

## 2021-06-01 DIAGNOSIS — G4700 Insomnia, unspecified: Secondary | ICD-10-CM | POA: Diagnosis not present

## 2021-06-01 DIAGNOSIS — J45998 Other asthma: Secondary | ICD-10-CM | POA: Diagnosis not present

## 2021-06-01 DIAGNOSIS — E782 Mixed hyperlipidemia: Secondary | ICD-10-CM | POA: Diagnosis not present

## 2021-06-01 DIAGNOSIS — I10 Essential (primary) hypertension: Secondary | ICD-10-CM | POA: Diagnosis not present

## 2021-06-01 DIAGNOSIS — F329 Major depressive disorder, single episode, unspecified: Secondary | ICD-10-CM | POA: Diagnosis not present

## 2021-06-01 DIAGNOSIS — E1165 Type 2 diabetes mellitus with hyperglycemia: Secondary | ICD-10-CM | POA: Diagnosis not present

## 2021-07-05 DIAGNOSIS — H35372 Puckering of macula, left eye: Secondary | ICD-10-CM | POA: Diagnosis not present

## 2021-07-05 DIAGNOSIS — E113313 Type 2 diabetes mellitus with moderate nonproliferative diabetic retinopathy with macular edema, bilateral: Secondary | ICD-10-CM | POA: Diagnosis not present

## 2021-07-19 DIAGNOSIS — E113311 Type 2 diabetes mellitus with moderate nonproliferative diabetic retinopathy with macular edema, right eye: Secondary | ICD-10-CM | POA: Diagnosis not present

## 2021-07-19 DIAGNOSIS — E113312 Type 2 diabetes mellitus with moderate nonproliferative diabetic retinopathy with macular edema, left eye: Secondary | ICD-10-CM | POA: Diagnosis not present

## 2021-08-01 DIAGNOSIS — U071 COVID-19: Secondary | ICD-10-CM | POA: Diagnosis not present

## 2021-08-17 DIAGNOSIS — E1165 Type 2 diabetes mellitus with hyperglycemia: Secondary | ICD-10-CM | POA: Diagnosis not present

## 2021-08-17 DIAGNOSIS — J45998 Other asthma: Secondary | ICD-10-CM | POA: Diagnosis not present

## 2021-08-17 DIAGNOSIS — G4700 Insomnia, unspecified: Secondary | ICD-10-CM | POA: Diagnosis not present

## 2021-08-17 DIAGNOSIS — E1140 Type 2 diabetes mellitus with diabetic neuropathy, unspecified: Secondary | ICD-10-CM | POA: Diagnosis not present

## 2021-08-17 DIAGNOSIS — M1990 Unspecified osteoarthritis, unspecified site: Secondary | ICD-10-CM | POA: Diagnosis not present

## 2021-08-17 DIAGNOSIS — F3342 Major depressive disorder, recurrent, in full remission: Secondary | ICD-10-CM | POA: Diagnosis not present

## 2021-08-17 DIAGNOSIS — I10 Essential (primary) hypertension: Secondary | ICD-10-CM | POA: Diagnosis not present

## 2021-08-17 DIAGNOSIS — E782 Mixed hyperlipidemia: Secondary | ICD-10-CM | POA: Diagnosis not present

## 2021-08-17 DIAGNOSIS — K219 Gastro-esophageal reflux disease without esophagitis: Secondary | ICD-10-CM | POA: Diagnosis not present

## 2021-09-12 DIAGNOSIS — H40053 Ocular hypertension, bilateral: Secondary | ICD-10-CM | POA: Diagnosis not present

## 2021-09-12 DIAGNOSIS — H0100B Unspecified blepharitis left eye, upper and lower eyelids: Secondary | ICD-10-CM | POA: Diagnosis not present

## 2021-09-12 DIAGNOSIS — H0100A Unspecified blepharitis right eye, upper and lower eyelids: Secondary | ICD-10-CM | POA: Diagnosis not present

## 2021-09-13 DIAGNOSIS — H3582 Retinal ischemia: Secondary | ICD-10-CM | POA: Diagnosis not present

## 2021-09-13 DIAGNOSIS — H35372 Puckering of macula, left eye: Secondary | ICD-10-CM | POA: Diagnosis not present

## 2021-09-13 DIAGNOSIS — E113313 Type 2 diabetes mellitus with moderate nonproliferative diabetic retinopathy with macular edema, bilateral: Secondary | ICD-10-CM | POA: Diagnosis not present

## 2021-09-13 DIAGNOSIS — H2513 Age-related nuclear cataract, bilateral: Secondary | ICD-10-CM | POA: Diagnosis not present

## 2021-09-21 DIAGNOSIS — G4700 Insomnia, unspecified: Secondary | ICD-10-CM | POA: Diagnosis not present

## 2021-09-21 DIAGNOSIS — I10 Essential (primary) hypertension: Secondary | ICD-10-CM | POA: Diagnosis not present

## 2021-09-21 DIAGNOSIS — Z23 Encounter for immunization: Secondary | ICD-10-CM | POA: Diagnosis not present

## 2021-09-21 DIAGNOSIS — K219 Gastro-esophageal reflux disease without esophagitis: Secondary | ICD-10-CM | POA: Diagnosis not present

## 2021-09-21 DIAGNOSIS — E162 Hypoglycemia, unspecified: Secondary | ICD-10-CM | POA: Diagnosis not present

## 2021-09-21 DIAGNOSIS — E1140 Type 2 diabetes mellitus with diabetic neuropathy, unspecified: Secondary | ICD-10-CM | POA: Diagnosis not present

## 2021-09-21 DIAGNOSIS — E782 Mixed hyperlipidemia: Secondary | ICD-10-CM | POA: Diagnosis not present

## 2021-09-21 DIAGNOSIS — E1165 Type 2 diabetes mellitus with hyperglycemia: Secondary | ICD-10-CM | POA: Diagnosis not present

## 2021-09-21 DIAGNOSIS — J45998 Other asthma: Secondary | ICD-10-CM | POA: Diagnosis not present

## 2021-09-21 DIAGNOSIS — M542 Cervicalgia: Secondary | ICD-10-CM | POA: Diagnosis not present

## 2021-09-21 DIAGNOSIS — F329 Major depressive disorder, single episode, unspecified: Secondary | ICD-10-CM | POA: Diagnosis not present

## 2021-10-25 DIAGNOSIS — I10 Essential (primary) hypertension: Secondary | ICD-10-CM | POA: Diagnosis not present

## 2021-10-25 DIAGNOSIS — E11319 Type 2 diabetes mellitus with unspecified diabetic retinopathy without macular edema: Secondary | ICD-10-CM | POA: Diagnosis not present

## 2021-10-25 DIAGNOSIS — G4700 Insomnia, unspecified: Secondary | ICD-10-CM | POA: Diagnosis not present

## 2021-10-25 DIAGNOSIS — J45998 Other asthma: Secondary | ICD-10-CM | POA: Diagnosis not present

## 2021-10-25 DIAGNOSIS — M542 Cervicalgia: Secondary | ICD-10-CM | POA: Diagnosis not present

## 2021-10-25 DIAGNOSIS — E782 Mixed hyperlipidemia: Secondary | ICD-10-CM | POA: Diagnosis not present

## 2021-10-25 DIAGNOSIS — E1140 Type 2 diabetes mellitus with diabetic neuropathy, unspecified: Secondary | ICD-10-CM | POA: Diagnosis not present

## 2021-10-25 DIAGNOSIS — E1165 Type 2 diabetes mellitus with hyperglycemia: Secondary | ICD-10-CM | POA: Diagnosis not present

## 2021-10-25 DIAGNOSIS — Z7984 Long term (current) use of oral hypoglycemic drugs: Secondary | ICD-10-CM | POA: Diagnosis not present

## 2021-10-25 DIAGNOSIS — F3342 Major depressive disorder, recurrent, in full remission: Secondary | ICD-10-CM | POA: Diagnosis not present

## 2021-10-25 DIAGNOSIS — E162 Hypoglycemia, unspecified: Secondary | ICD-10-CM | POA: Diagnosis not present

## 2021-11-08 DIAGNOSIS — E113311 Type 2 diabetes mellitus with moderate nonproliferative diabetic retinopathy with macular edema, right eye: Secondary | ICD-10-CM | POA: Diagnosis not present

## 2021-11-08 DIAGNOSIS — H35372 Puckering of macula, left eye: Secondary | ICD-10-CM | POA: Diagnosis not present

## 2021-11-08 DIAGNOSIS — H2513 Age-related nuclear cataract, bilateral: Secondary | ICD-10-CM | POA: Diagnosis not present

## 2021-11-08 DIAGNOSIS — H3582 Retinal ischemia: Secondary | ICD-10-CM | POA: Diagnosis not present

## 2021-11-08 DIAGNOSIS — E113313 Type 2 diabetes mellitus with moderate nonproliferative diabetic retinopathy with macular edema, bilateral: Secondary | ICD-10-CM | POA: Diagnosis not present

## 2021-11-24 DIAGNOSIS — F3342 Major depressive disorder, recurrent, in full remission: Secondary | ICD-10-CM | POA: Diagnosis not present

## 2021-11-24 DIAGNOSIS — K219 Gastro-esophageal reflux disease without esophagitis: Secondary | ICD-10-CM | POA: Diagnosis not present

## 2021-11-24 DIAGNOSIS — G4700 Insomnia, unspecified: Secondary | ICD-10-CM | POA: Diagnosis not present

## 2021-11-24 DIAGNOSIS — E782 Mixed hyperlipidemia: Secondary | ICD-10-CM | POA: Diagnosis not present

## 2021-11-24 DIAGNOSIS — I10 Essential (primary) hypertension: Secondary | ICD-10-CM | POA: Diagnosis not present

## 2021-11-24 DIAGNOSIS — E1140 Type 2 diabetes mellitus with diabetic neuropathy, unspecified: Secondary | ICD-10-CM | POA: Diagnosis not present

## 2021-11-24 DIAGNOSIS — E1165 Type 2 diabetes mellitus with hyperglycemia: Secondary | ICD-10-CM | POA: Diagnosis not present

## 2021-11-24 DIAGNOSIS — J45998 Other asthma: Secondary | ICD-10-CM | POA: Diagnosis not present

## 2021-11-24 DIAGNOSIS — E11319 Type 2 diabetes mellitus with unspecified diabetic retinopathy without macular edema: Secondary | ICD-10-CM | POA: Diagnosis not present

## 2022-01-17 DIAGNOSIS — E113311 Type 2 diabetes mellitus with moderate nonproliferative diabetic retinopathy with macular edema, right eye: Secondary | ICD-10-CM | POA: Diagnosis not present

## 2022-01-17 DIAGNOSIS — E113392 Type 2 diabetes mellitus with moderate nonproliferative diabetic retinopathy without macular edema, left eye: Secondary | ICD-10-CM | POA: Diagnosis not present

## 2022-01-17 DIAGNOSIS — H35372 Puckering of macula, left eye: Secondary | ICD-10-CM | POA: Diagnosis not present

## 2022-01-17 DIAGNOSIS — H3582 Retinal ischemia: Secondary | ICD-10-CM | POA: Diagnosis not present

## 2022-01-30 DIAGNOSIS — F3342 Major depressive disorder, recurrent, in full remission: Secondary | ICD-10-CM | POA: Diagnosis not present

## 2022-01-30 DIAGNOSIS — E785 Hyperlipidemia, unspecified: Secondary | ICD-10-CM | POA: Diagnosis not present

## 2022-01-30 DIAGNOSIS — J45998 Other asthma: Secondary | ICD-10-CM | POA: Diagnosis not present

## 2022-01-30 DIAGNOSIS — K219 Gastro-esophageal reflux disease without esophagitis: Secondary | ICD-10-CM | POA: Diagnosis not present

## 2022-01-30 DIAGNOSIS — I10 Essential (primary) hypertension: Secondary | ICD-10-CM | POA: Diagnosis not present

## 2022-01-30 DIAGNOSIS — E1140 Type 2 diabetes mellitus with diabetic neuropathy, unspecified: Secondary | ICD-10-CM | POA: Diagnosis not present

## 2022-01-30 DIAGNOSIS — G4700 Insomnia, unspecified: Secondary | ICD-10-CM | POA: Diagnosis not present

## 2022-01-30 DIAGNOSIS — E08319 Diabetes mellitus due to underlying condition with unspecified diabetic retinopathy without macular edema: Secondary | ICD-10-CM | POA: Diagnosis not present

## 2022-01-30 DIAGNOSIS — E1165 Type 2 diabetes mellitus with hyperglycemia: Secondary | ICD-10-CM | POA: Diagnosis not present

## 2022-02-02 DIAGNOSIS — I359 Nonrheumatic aortic valve disorder, unspecified: Secondary | ICD-10-CM | POA: Diagnosis not present

## 2022-02-08 IMAGING — MR MR CERVICAL SPINE W/O CM
5 series · 27 of 48 positions shown · non-contrast
Comparison: None available.

CLINICAL DATA: Initial evaluation for right upper extremity
tingling for 2 months.

EXAM:
MRI CERVICAL SPINE WITHOUT CONTRAST
TECHNIQUE: Multiplanar, multisequence MR imaging of the cervical spine was
performed. No intravenous contrast was administered.

[Series 5: T1 · sagittal · 3.0mm · 0.66mm/px · 6 of 15 slices shown]
[im 1/15]
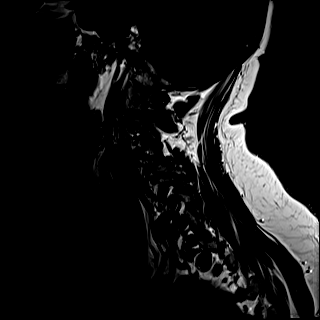
[im 3/15]
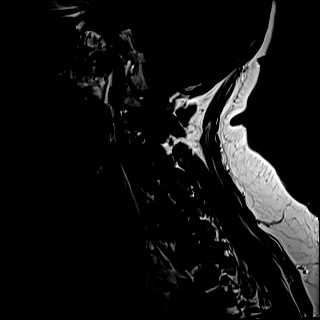
[im 6/15]
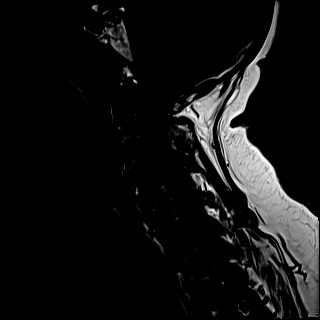
[im 9/15]
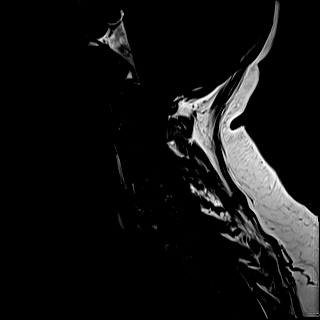
[im 12/15]
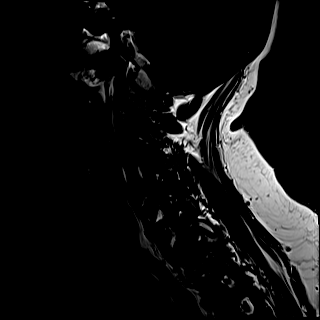
[im 15/15]
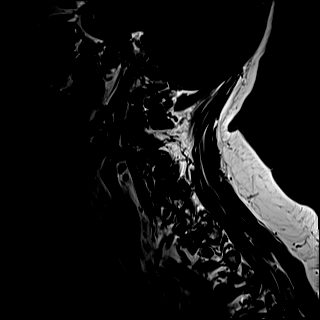

[Series 6: T2 · sagittal · 3.0mm · 0.55mm/px · 5 of 15 slices shown (1 of 2)]
[im 1/15]
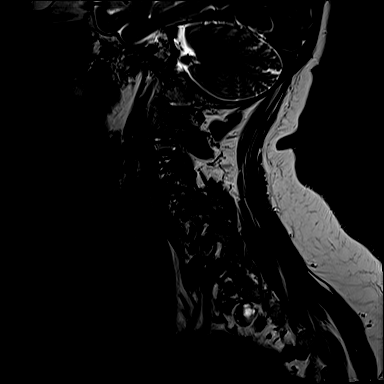
[im 4/15]
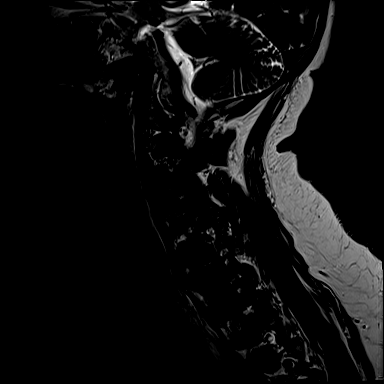
[im 8/15]
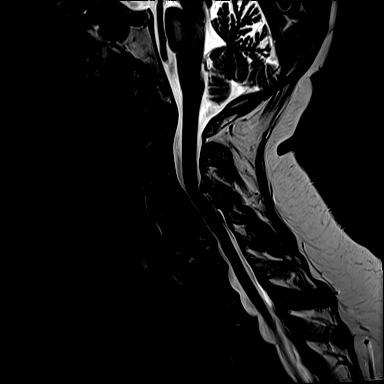
[im 11/15]
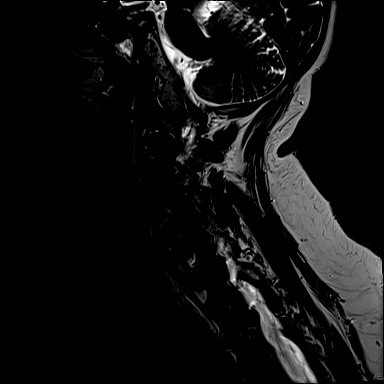
[im 15/15]
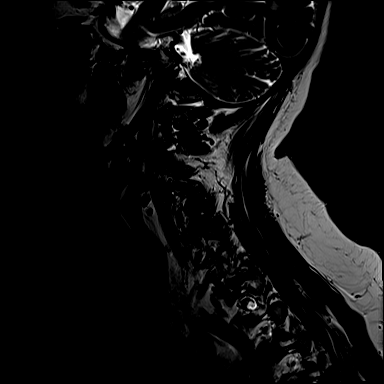

[Series 7: STIR · sagittal · 3.0mm · 0.33mm/px · 5 of 15 slices shown]
[im 1/15]
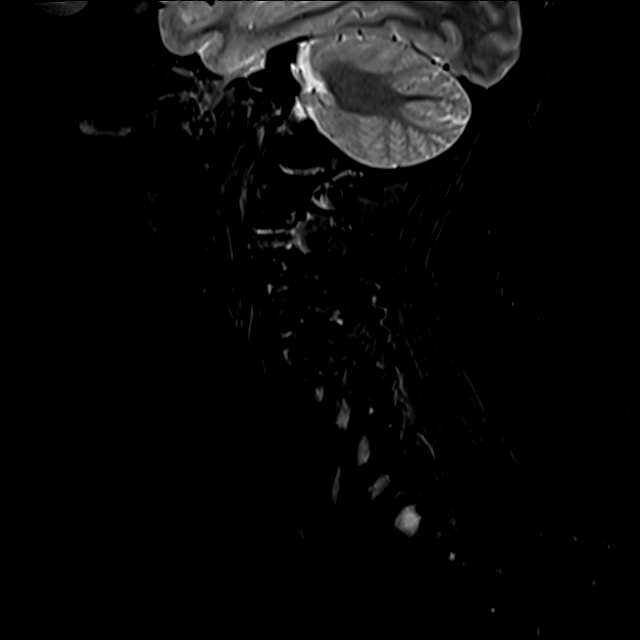
[im 4/15]
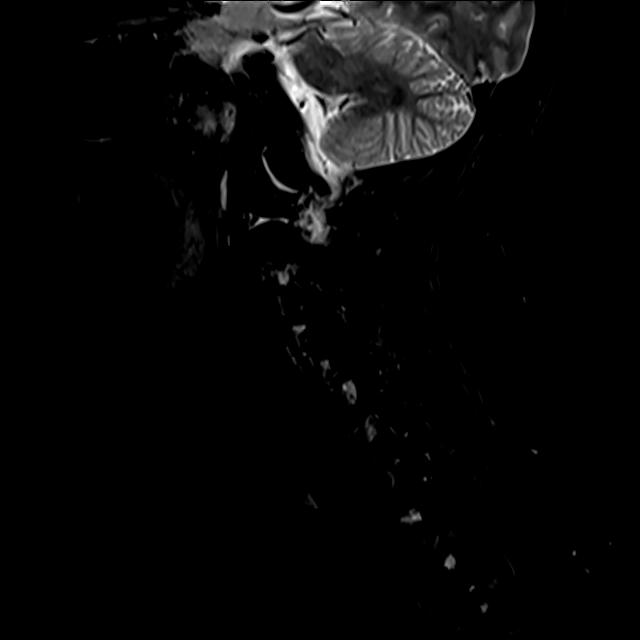
[im 8/15]
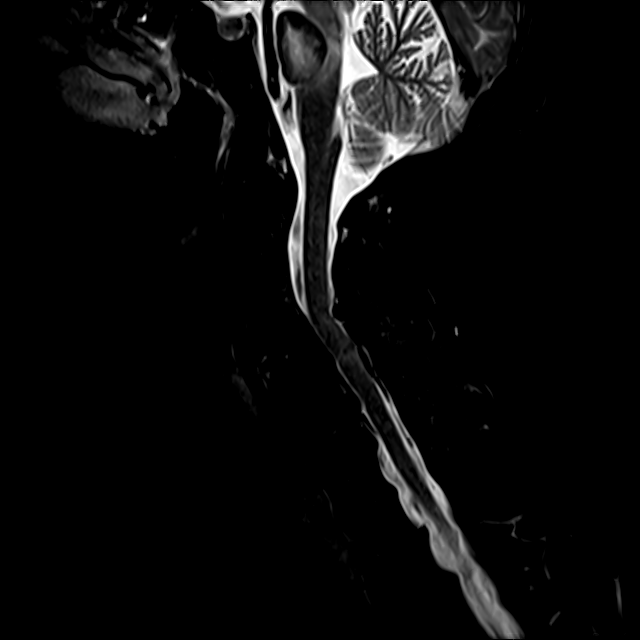
[im 11/15]
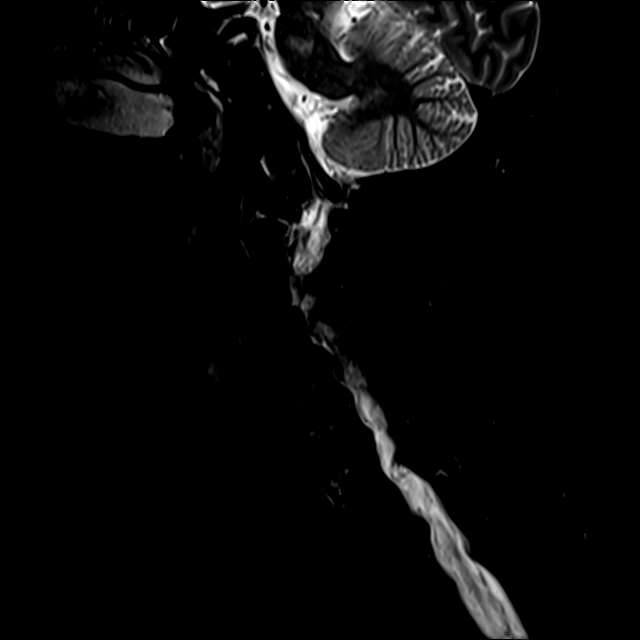
[im 15/15]
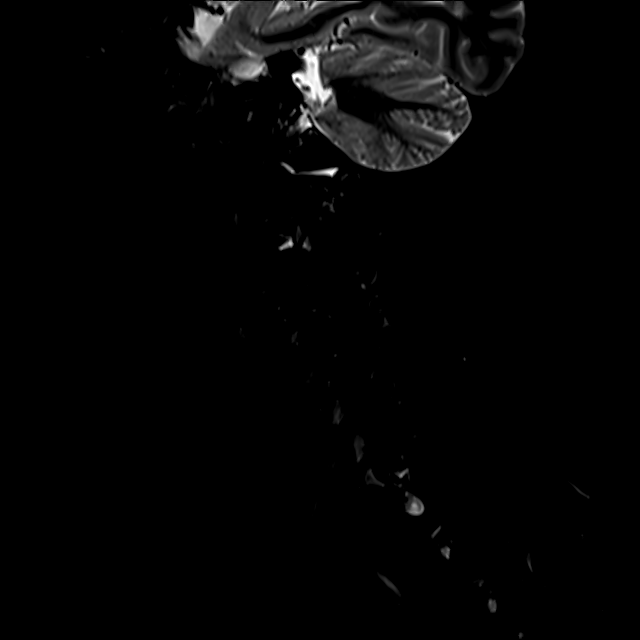

[Series 8: T2 · axial · 3.0mm · 0.50mm/px · z∈[-105,+16]mm · 10 of 44 slices shown (2 of 2)]
[im 3/44]
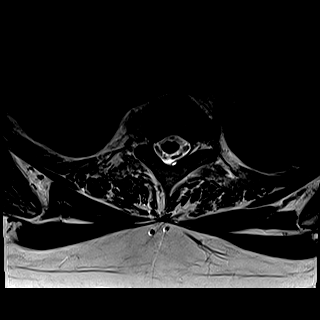
[im 6/44]
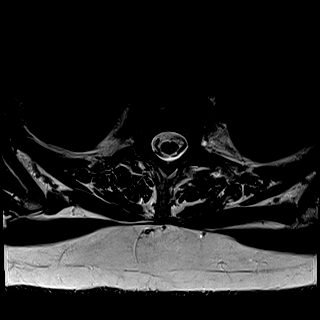
[im 9/44]
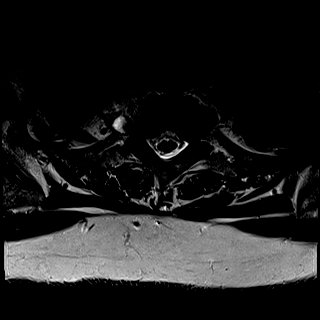
[im 15/44]
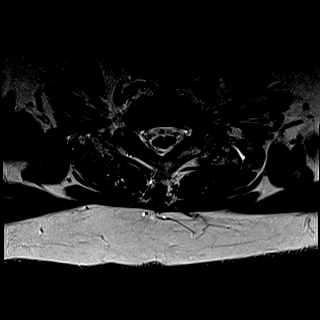
[im 21/44]
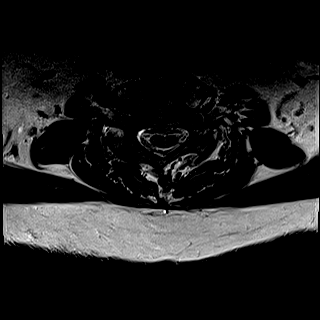
[im 23/44]
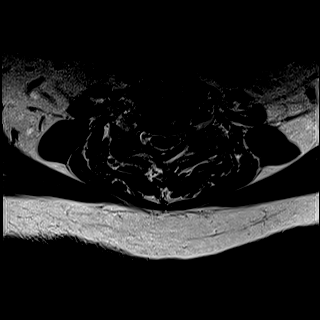
[im 26/44]
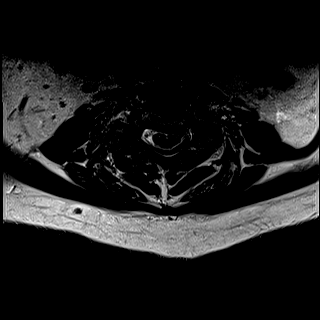
[im 32/44]
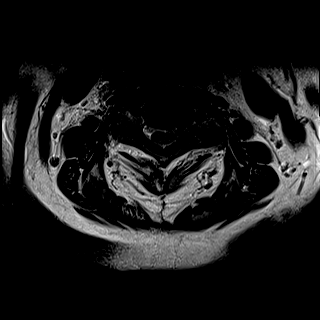
[im 38/44]
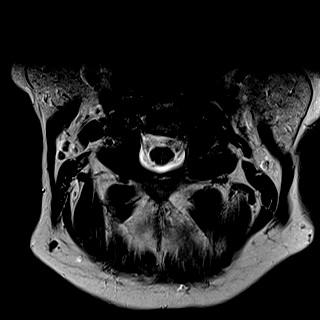
[im 44/44]
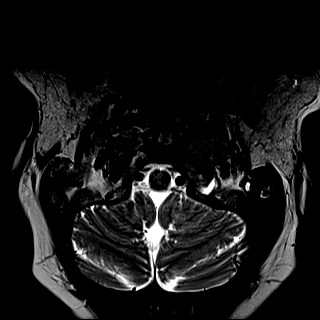

[Series 9: GRE · axial · 3.0mm · 0.42mm/px · 1 of 44 slices shown]
[im 3/44]
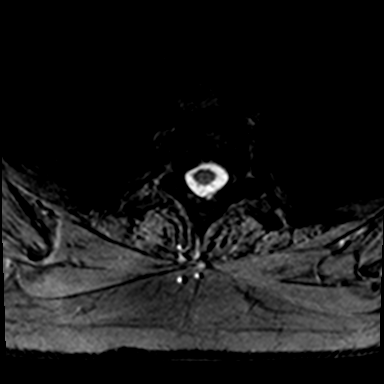

[27 of 48 positions shown; findings below may reference images not displayed]

FINDINGS: Alignment: Straightening of the normal cervical lordosis. Trace
anterolisthesis of C3 on C4 and C4 on C5, chronic and facet
mediated.

Vertebrae: Vertebral body height maintained without evidence for
acute or chronic fracture. Bone marrow signal intensity mildly
heterogeneous but within normal limits. No discrete or worrisome
osseous lesions. No abnormal marrow edema.

Cord: Signal intensity within the cervical spinal cord is within
normal limits.

Posterior Fossa, vertebral arteries, paraspinal tissues: Prominent
T2 signal abnormality seen within the visualized pons and brainstem,
most likely related chronic microvascular ischemic disease.
Craniocervical junction within normal limits. Paraspinous and
prevertebral soft tissues normal. Normal flow voids seen within the
vertebral arteries bilaterally.

Disc levels:

C2-C3: Mild left greater than right uncovertebral hypertrophy
without disc bulge. Moderate left facet degeneration. Resultant
moderate left C3 foraminal stenosis. No canal or right foraminal
narrowing.

C3-C4: Mild disc bulge with uncovertebral hypertrophy. Moderate
bilateral facet degeneration. Resultant mild spinal stenosis. Mild
to moderate bilateral C4 foraminal stenosis.

C4-C5: Disc bulge with right greater than left uncovertebral
hypertrophy. Bulging disc flattens and partially faces the ventral
thecal sac, slightly asymmetric to the right. Severe right with
moderate left facet degeneration with ligament flavum hypertrophy.
Resultant mild spinal stenosis. Moderate right worse than left C5
foraminal narrowing.

C5-C6: Chronic intervertebral disc space narrowing with diffuse disc
osteophyte complex. Broad posterior component flattens and partially
effaces the ventral thecal sac. Moderate left greater than right
facet degeneration. Mild spinal stenosis without cord deformity.
Moderate left C6 foraminal narrowing.

C6-C7: Diffuse disc bulge with bilateral uncovertebral hypertrophy.
Flattening and partial effacement of the ventral thecal sac.
Superimposed mild facet and ligament flavum hypertrophy. No
significant spinal stenosis. Foramina remain patent.

C7-T1: Mild disc bulge with uncovertebral hypertrophy. Severe left
with mild right facet degeneration. Resultant moderate left with
mild right C8 foraminal stenosis. No spinal stenosis.

T1-2: Disc bulge with bilateral uncovertebral and facet hypertrophy.
No spinal stenosis. Moderate right with mild left foraminal
narrowing.

T1-2: Disc bulge with bilateral uncovertebral hypertrophy.
Left-sided facet degeneration. No spinal stenosis. Mild right
foraminal narrowing.
IMPRESSION: 1. Multilevel cervical spondylosis with resultant mild diffuse
spinal stenosis at C3-4 through C5-6.
2. Multifactorial degenerative changes with resultant moderate left
C3, bilateral C5, with left C6 and C8 foraminal stenosis.
3. Advanced multilevel facet degeneration throughout the cervical
spine as above. Findings could contribute to underlying neck pain.

## 2022-02-15 DIAGNOSIS — E782 Mixed hyperlipidemia: Secondary | ICD-10-CM | POA: Diagnosis not present

## 2022-02-15 DIAGNOSIS — E1140 Type 2 diabetes mellitus with diabetic neuropathy, unspecified: Secondary | ICD-10-CM | POA: Diagnosis not present

## 2022-02-15 DIAGNOSIS — I10 Essential (primary) hypertension: Secondary | ICD-10-CM | POA: Diagnosis not present

## 2022-03-14 DIAGNOSIS — H52203 Unspecified astigmatism, bilateral: Secondary | ICD-10-CM | POA: Diagnosis not present

## 2022-03-14 DIAGNOSIS — H40053 Ocular hypertension, bilateral: Secondary | ICD-10-CM | POA: Diagnosis not present

## 2022-03-14 DIAGNOSIS — E113313 Type 2 diabetes mellitus with moderate nonproliferative diabetic retinopathy with macular edema, bilateral: Secondary | ICD-10-CM | POA: Diagnosis not present

## 2022-03-14 DIAGNOSIS — H2513 Age-related nuclear cataract, bilateral: Secondary | ICD-10-CM | POA: Diagnosis not present

## 2022-04-04 DIAGNOSIS — R490 Dysphonia: Secondary | ICD-10-CM | POA: Diagnosis not present

## 2022-04-04 DIAGNOSIS — Z1331 Encounter for screening for depression: Secondary | ICD-10-CM | POA: Diagnosis not present

## 2022-04-04 DIAGNOSIS — E11319 Type 2 diabetes mellitus with unspecified diabetic retinopathy without macular edema: Secondary | ICD-10-CM | POA: Diagnosis not present

## 2022-04-04 DIAGNOSIS — I10 Essential (primary) hypertension: Secondary | ICD-10-CM | POA: Diagnosis not present

## 2022-04-04 DIAGNOSIS — E1140 Type 2 diabetes mellitus with diabetic neuropathy, unspecified: Secondary | ICD-10-CM | POA: Diagnosis not present

## 2022-04-04 DIAGNOSIS — F3342 Major depressive disorder, recurrent, in full remission: Secondary | ICD-10-CM | POA: Diagnosis not present

## 2022-04-04 DIAGNOSIS — E782 Mixed hyperlipidemia: Secondary | ICD-10-CM | POA: Diagnosis not present

## 2022-04-04 DIAGNOSIS — E559 Vitamin D deficiency, unspecified: Secondary | ICD-10-CM | POA: Diagnosis not present

## 2022-04-04 DIAGNOSIS — M189 Osteoarthritis of first carpometacarpal joint, unspecified: Secondary | ICD-10-CM | POA: Diagnosis not present

## 2022-04-04 DIAGNOSIS — E1165 Type 2 diabetes mellitus with hyperglycemia: Secondary | ICD-10-CM | POA: Diagnosis not present

## 2022-04-04 DIAGNOSIS — K219 Gastro-esophageal reflux disease without esophagitis: Secondary | ICD-10-CM | POA: Diagnosis not present

## 2022-04-04 DIAGNOSIS — Z0000 Encounter for general adult medical examination without abnormal findings: Secondary | ICD-10-CM | POA: Diagnosis not present

## 2022-04-09 DIAGNOSIS — G4700 Insomnia, unspecified: Secondary | ICD-10-CM | POA: Diagnosis not present

## 2022-04-09 DIAGNOSIS — E1165 Type 2 diabetes mellitus with hyperglycemia: Secondary | ICD-10-CM | POA: Diagnosis not present

## 2022-04-09 DIAGNOSIS — E782 Mixed hyperlipidemia: Secondary | ICD-10-CM | POA: Diagnosis not present

## 2022-04-09 DIAGNOSIS — I10 Essential (primary) hypertension: Secondary | ICD-10-CM | POA: Diagnosis not present

## 2022-04-10 ENCOUNTER — Other Ambulatory Visit: Payer: Self-pay | Admitting: Internal Medicine

## 2022-04-10 DIAGNOSIS — Z1231 Encounter for screening mammogram for malignant neoplasm of breast: Secondary | ICD-10-CM

## 2022-04-11 DIAGNOSIS — H35372 Puckering of macula, left eye: Secondary | ICD-10-CM | POA: Diagnosis not present

## 2022-04-11 DIAGNOSIS — E113313 Type 2 diabetes mellitus with moderate nonproliferative diabetic retinopathy with macular edema, bilateral: Secondary | ICD-10-CM | POA: Diagnosis not present

## 2022-04-11 DIAGNOSIS — H3582 Retinal ischemia: Secondary | ICD-10-CM | POA: Diagnosis not present

## 2022-04-11 DIAGNOSIS — E113311 Type 2 diabetes mellitus with moderate nonproliferative diabetic retinopathy with macular edema, right eye: Secondary | ICD-10-CM | POA: Diagnosis not present

## 2022-04-11 DIAGNOSIS — H2513 Age-related nuclear cataract, bilateral: Secondary | ICD-10-CM | POA: Diagnosis not present

## 2022-05-02 ENCOUNTER — Ambulatory Visit
Admission: RE | Admit: 2022-05-02 | Discharge: 2022-05-02 | Disposition: A | Discharge status: Home / Self Care | Payer: Medicare PPO | Source: Ambulatory Visit | Attending: Internal Medicine | Admitting: Internal Medicine

## 2022-05-02 DIAGNOSIS — Z1231 Encounter for screening mammogram for malignant neoplasm of breast: Secondary | ICD-10-CM | POA: Diagnosis not present

## 2022-05-07 DIAGNOSIS — T8544XD Capsular contracture of breast implant, subsequent encounter: Secondary | ICD-10-CM | POA: Diagnosis not present

## 2022-05-07 DIAGNOSIS — T8544XA Capsular contracture of breast implant, initial encounter: Secondary | ICD-10-CM | POA: Insufficient documentation

## 2022-05-07 DIAGNOSIS — Z9889 Other specified postprocedural states: Secondary | ICD-10-CM | POA: Diagnosis not present

## 2022-05-07 DIAGNOSIS — Z853 Personal history of malignant neoplasm of breast: Secondary | ICD-10-CM | POA: Insufficient documentation

## 2022-05-31 DIAGNOSIS — R490 Dysphonia: Secondary | ICD-10-CM | POA: Diagnosis not present

## 2022-05-31 DIAGNOSIS — I951 Orthostatic hypotension: Secondary | ICD-10-CM | POA: Diagnosis not present

## 2022-05-31 DIAGNOSIS — K5901 Slow transit constipation: Secondary | ICD-10-CM | POA: Diagnosis not present

## 2022-05-31 DIAGNOSIS — E7800 Pure hypercholesterolemia, unspecified: Secondary | ICD-10-CM | POA: Diagnosis not present

## 2022-06-18 DIAGNOSIS — K219 Gastro-esophageal reflux disease without esophagitis: Secondary | ICD-10-CM | POA: Diagnosis not present

## 2022-06-18 DIAGNOSIS — R490 Dysphonia: Secondary | ICD-10-CM | POA: Diagnosis not present

## 2022-06-18 DIAGNOSIS — R0982 Postnasal drip: Secondary | ICD-10-CM | POA: Diagnosis not present

## 2022-06-25 DIAGNOSIS — E782 Mixed hyperlipidemia: Secondary | ICD-10-CM | POA: Diagnosis not present

## 2022-06-25 DIAGNOSIS — I10 Essential (primary) hypertension: Secondary | ICD-10-CM | POA: Diagnosis not present

## 2022-06-25 DIAGNOSIS — E1165 Type 2 diabetes mellitus with hyperglycemia: Secondary | ICD-10-CM | POA: Diagnosis not present

## 2022-06-26 DIAGNOSIS — I10 Essential (primary) hypertension: Secondary | ICD-10-CM | POA: Diagnosis not present

## 2022-06-26 DIAGNOSIS — I951 Orthostatic hypotension: Secondary | ICD-10-CM | POA: Diagnosis not present

## 2022-07-04 DIAGNOSIS — H3582 Retinal ischemia: Secondary | ICD-10-CM | POA: Diagnosis not present

## 2022-07-04 DIAGNOSIS — E113313 Type 2 diabetes mellitus with moderate nonproliferative diabetic retinopathy with macular edema, bilateral: Secondary | ICD-10-CM | POA: Diagnosis not present

## 2022-07-04 DIAGNOSIS — E113311 Type 2 diabetes mellitus with moderate nonproliferative diabetic retinopathy with macular edema, right eye: Secondary | ICD-10-CM | POA: Diagnosis not present

## 2022-07-04 DIAGNOSIS — H25813 Combined forms of age-related cataract, bilateral: Secondary | ICD-10-CM | POA: Diagnosis not present

## 2022-07-04 DIAGNOSIS — H35372 Puckering of macula, left eye: Secondary | ICD-10-CM | POA: Diagnosis not present

## 2022-07-06 ENCOUNTER — Other Ambulatory Visit: Payer: Self-pay

## 2022-07-06 DIAGNOSIS — W19XXXA Unspecified fall, initial encounter: Secondary | ICD-10-CM | POA: Diagnosis not present

## 2022-07-06 DIAGNOSIS — M436 Torticollis: Secondary | ICD-10-CM | POA: Diagnosis not present

## 2022-07-06 MED ORDER — CYCLOBENZAPRINE HCL 5 MG PO TABS
ORAL_TABLET | ORAL | 0 refills | Status: DC
  Filled 2022-07-06: qty 15, 5d supply, fill #0

## 2022-07-19 DIAGNOSIS — R42 Dizziness and giddiness: Secondary | ICD-10-CM | POA: Diagnosis not present

## 2022-07-19 DIAGNOSIS — I10 Essential (primary) hypertension: Secondary | ICD-10-CM | POA: Diagnosis not present

## 2022-08-06 DIAGNOSIS — I10 Essential (primary) hypertension: Secondary | ICD-10-CM | POA: Diagnosis not present

## 2022-08-06 DIAGNOSIS — R42 Dizziness and giddiness: Secondary | ICD-10-CM | POA: Diagnosis not present

## 2022-08-06 DIAGNOSIS — E1165 Type 2 diabetes mellitus with hyperglycemia: Secondary | ICD-10-CM | POA: Diagnosis not present

## 2022-09-03 DIAGNOSIS — R42 Dizziness and giddiness: Secondary | ICD-10-CM | POA: Diagnosis not present

## 2022-09-03 DIAGNOSIS — I10 Essential (primary) hypertension: Secondary | ICD-10-CM | POA: Diagnosis not present

## 2022-09-03 DIAGNOSIS — E1165 Type 2 diabetes mellitus with hyperglycemia: Secondary | ICD-10-CM | POA: Diagnosis not present

## 2022-09-03 DIAGNOSIS — K5901 Slow transit constipation: Secondary | ICD-10-CM | POA: Diagnosis not present

## 2022-09-19 DIAGNOSIS — H40053 Ocular hypertension, bilateral: Secondary | ICD-10-CM | POA: Diagnosis not present

## 2022-09-19 DIAGNOSIS — H2513 Age-related nuclear cataract, bilateral: Secondary | ICD-10-CM | POA: Diagnosis not present

## 2022-09-26 DIAGNOSIS — E113313 Type 2 diabetes mellitus with moderate nonproliferative diabetic retinopathy with macular edema, bilateral: Secondary | ICD-10-CM | POA: Diagnosis not present

## 2022-09-26 DIAGNOSIS — H35372 Puckering of macula, left eye: Secondary | ICD-10-CM | POA: Diagnosis not present

## 2022-09-26 DIAGNOSIS — H25813 Combined forms of age-related cataract, bilateral: Secondary | ICD-10-CM | POA: Diagnosis not present

## 2022-09-26 DIAGNOSIS — H3582 Retinal ischemia: Secondary | ICD-10-CM | POA: Diagnosis not present

## 2022-10-31 DIAGNOSIS — I10 Essential (primary) hypertension: Secondary | ICD-10-CM | POA: Diagnosis not present

## 2022-10-31 DIAGNOSIS — R42 Dizziness and giddiness: Secondary | ICD-10-CM | POA: Diagnosis not present

## 2022-10-31 DIAGNOSIS — E1165 Type 2 diabetes mellitus with hyperglycemia: Secondary | ICD-10-CM | POA: Diagnosis not present

## 2022-12-19 DIAGNOSIS — H3582 Retinal ischemia: Secondary | ICD-10-CM | POA: Diagnosis not present

## 2022-12-19 DIAGNOSIS — E113313 Type 2 diabetes mellitus with moderate nonproliferative diabetic retinopathy with macular edema, bilateral: Secondary | ICD-10-CM | POA: Diagnosis not present

## 2022-12-19 DIAGNOSIS — H25813 Combined forms of age-related cataract, bilateral: Secondary | ICD-10-CM | POA: Diagnosis not present

## 2022-12-19 DIAGNOSIS — H35372 Puckering of macula, left eye: Secondary | ICD-10-CM | POA: Diagnosis not present

## 2022-12-24 DIAGNOSIS — R42 Dizziness and giddiness: Secondary | ICD-10-CM | POA: Diagnosis not present

## 2022-12-24 DIAGNOSIS — I10 Essential (primary) hypertension: Secondary | ICD-10-CM | POA: Diagnosis not present

## 2022-12-24 DIAGNOSIS — E1165 Type 2 diabetes mellitus with hyperglycemia: Secondary | ICD-10-CM | POA: Diagnosis not present

## 2023-01-03 ENCOUNTER — Other Ambulatory Visit: Payer: Self-pay

## 2023-01-28 ENCOUNTER — Encounter: Payer: Self-pay | Admitting: Internal Medicine

## 2023-01-28 ENCOUNTER — Ambulatory Visit: Payer: Medicare PPO | Attending: Internal Medicine | Admitting: Internal Medicine

## 2023-01-28 VITALS — BP 118/63 | HR 82 | Ht 64.0 in | Wt 187.2 lb

## 2023-01-28 DIAGNOSIS — E859 Amyloidosis, unspecified: Secondary | ICD-10-CM | POA: Diagnosis not present

## 2023-01-28 DIAGNOSIS — R519 Headache, unspecified: Secondary | ICD-10-CM | POA: Diagnosis not present

## 2023-01-28 DIAGNOSIS — I10 Essential (primary) hypertension: Secondary | ICD-10-CM

## 2023-01-28 NOTE — Patient Instructions (Addendum)
Medication Instructions:  Your physician recommends that you continue on your current medications as directed. Please refer to the Current Medication list given to you today.  *If you need a refill on your cardiac medications before your next appointment, please call your pharmacy*  Lab Work: None ordered.  If you have labs (blood work) drawn today and your tests are completely normal, you will receive your results only by: [place] (if you have MyChart) OR A paper copy in the mail If you have any lab test that is abnormal or we need to change your treatment, we will call you to review the results.  Testing/Procedures: Echocardiogram complete was ordered by Dr. Virl Davis.  PYP Scan ordered by Dr. Virl Davis.   Follow-Up: Please schedule a follow up telehealth appointment for 6-8 weeks with Dr. Virl Davis.    Dr. Virl Davis has ordered patient to wear an abdominal binder and sleeves.  These items can be obtained / purchased at [employer], or at [facility] supply on [address].   Your next appointment:   6/8 week f/u appointment per Dr. Virl Davis.   The format for your next appointment:   In Person  Provider:   Virl Axe, MD{or one of the following Advanced Practice Providers on your designated Care Team:   Tommye Standard, [place] Legrand Como "Jonni Sanger" Chalmers Cater, [place]  Echocardiogram An echocardiogram is a test that uses sound waves (ultrasound) to produce images of the heart. Images from an echocardiogram can provide important information about: Heart size and shape. The size and thickness and movement of your heart's walls. Heart muscle function and strength. Heart valve function or if you have stenosis. Stenosis is when the heart valves are too narrow. If blood is flowing backward through the heart valves (regurgitation). A tumor or infectious growth around the heart valves. Areas of heart muscle that are not working well because of poor blood flow or  injury from a heart attack. Aneurysm detection. An aneurysm is a weak or damaged part of an artery wall. The wall bulges out from the normal force of blood pumping through the body. Tell a health care provider about: Any allergies you have. All medicines you are taking, including vitamins, herbs, eye drops, creams, and over-the-counter medicines. Any blood disorders you have. Any surgeries you have had. Any medical conditions you have. Whether you are pregnant or may be pregnant. What are the risks? Generally, this is a safe test. However, problems may occur, including an allergic reaction to dye (contrast) that may be used during the test. What happens before the test? No specific preparation is needed. You may eat and drink normally. What happens during the test?  You will take off your clothes from the waist up and put on a hospital gown. Electrodes or electrocardiogram (ECG)patches may be placed on your chest. The electrodes or patches are then connected to a device that monitors your heart rate and rhythm. You will lie down on a table for an ultrasound exam. A gel will be applied to your chest to help sound waves pass through your skin. A handheld device, called a transducer, will be pressed against your chest and moved over your heart. The transducer produces sound waves that travel to your heart and bounce back (or "echo" back) to the transducer. These sound waves will be captured in real-time and changed into images of your heart that can be viewed on a video monitor. The images will be recorded on a computer and reviewed  by your health care provider. You may be asked to change positions or hold your breath for a short time. This makes it easier to get different views or better views of your heart. In some cases, you may receive contrast through an IV in one of your veins. This can improve the quality of the pictures from your heart. The procedure may vary among health care providers and  hospitals. What can I expect after the test? You may return to your normal, everyday life, including diet, activities, and medicines, unless your health care provider tells you not to do that. Follow these instructions at home: It is up to you to get the results of your test. Ask your health care provider, or the department that is doing the test, when your results will be ready. Keep all follow-up visits. This is important. Summary An echocardiogram is a test that uses sound waves (ultrasound) to produce images of the heart. Images from an echocardiogram can provide important information about the size and shape of your heart, heart muscle function, heart valve function, and other possible heart problems. You do not need to do anything to prepare before this test. You may eat and drink normally. After the echocardiogram is completed, you may return to your normal, everyday life, unless your health care provider tells you not to do that. This information is not intended to replace advice given to you by your health care provider. Make sure you discuss any questions you have with your health care provider. Document Revised: 07/26/2021 Document Reviewed: 07/05/2020 Elsevier Patient Education  [employer].

## 2023-01-28 NOTE — Progress Notes (Addendum)
ELECTROPHYSIOLOGY CONSULT NOTE  Patient ID: [name], MRN: [id number], DOB/AGE: [birth year] 72 y.o. Admit date: (Not on file) Date of Consult: 01/28/2023  Primary Physician: Charlane Ferretti, MD Primary Cardiologist: new     [name] is a 71 y.o. female who is being seen today for the evaluation of orthostatic hypotension the request of Dr Louis Matte.    HPI [name] is a 72 y.o. female referred for "orthostatic hypotension ".  Her symptoms go back about 2 years, when she started noticing lightheadedness with standing, distinct from vertigo.  Provoked by standing and or walking.  Would pass typically in a couple of minutes.  Some problems post defecation and in the shower.  Some heat intolerance.  She keeps records of her blood pressure and has for a few years, and since 2022 has had quite labile blood pressures ranging from highs in the 170s and 80s previously with lows in the 130s and now with lows in the 80s and 90s.  There has been a marked effort to down titrate her medications with incomplete resolution of her symptoms.  Her husband has not noted change in her skin color.  She does have problems with constipation dry mouth and dry eyes.  Has noted no changes in her sweating. Bilateral carpal tunnel and longstanding diabetes albeit with only modest neuropathy  No significant palpitations.  Fatigue with exertion but not dyspnea  Left retroorbital headaches   Breast cancer 1990s  DATE TEST EF         Date Cr K Hgb  5/23 0.97 4.5 15.1            Past Medical History:  Diagnosis Date   Arthritis    Background diabetic retinopathy ([place])    Breast cancer ([place]) 1996   Right Breast Cancer   Cancer ([place]) 1996   breast    Chronic major depressive disorder, single episode    Depression    Diabetes mellitus    Diabetic retinopathy associated with diabetes mellitus due to underlying condition ([place])    DJD (degenerative joint disease)     Drug-induced myopathy    Eczema    GERD (gastroesophageal reflux disease)    Hemorrhage of anus and rectum    HTN (hypertension)    Hyperlipidemia    Kidney stone 05/2012   Low back pain    Major depressive disorder, recurrent episode, in full remission ([place])    Migraine    Orthostatic dizziness    Osteoarthritis of first carpometacarpal joint, unspecified    Osteoarthritis of knee, unspecified    Phobic anxiety disorder    Pure hypercholesterolemia    Stage 3a chronic kidney disease ([place])    Vertigo    Vitamin D deficiency       Surgical History:  Past Surgical History:  Procedure Laterality Date   APPENDECTOMY     BREAST RECONSTRUCTION     BREAST REDUCTION SURGERY     BREAST SURGERY     partial mastectomy   CERVICAL DISC SURGERY     CHOLECYSTECTOMY     FOREARM FRACTURE SURGERY     INCONTINENCE SURGERY     INSERTION OF TISSUE EXPANDER AFTER MASTECTOMY     JOINT REPLACEMENT     KNEE ARTHROSCOPY     MASTECTOMY Right 1996   NEUROPLASTY / TRANSPOSITION MEDIAN NERVE AT [place]  TOTAL KNEE ARTHROPLASTY     TUBAL LIGATION       Home Meds: Current Meds  Medication Sig   acetaminophen (QC ACETAMINOPHEN 8HR ARTH PAIN) 650 MG CR tablet Take 1,300 mg by mouth in the morning and at bedtime.   ALPRAZolam (XANAX) 1 MG tablet Take 1 mg by mouth at bedtime as needed for anxiety. anxiety   ascorbic acid (VITAMIN C) 500 MG tablet Take 500 mg by mouth daily.   aspirin 81 MG tablet Take 1 tablet (81 mg total) by mouth daily.   Biotin 10000 MCG TABS Take 1 tablet by mouth daily.   calcium-vitamin D (OSCAL WITH D) 500-200 MG-UNIT per tablet Take 1 tablet by mouth.   celecoxib (CELEBREX) 100 MG capsule Take 100 mg by mouth 2 (two) times daily.   cephALEXin (KEFLEX) 500 MG capsule Take 500 mg by mouth. Take 4 tabs 1 hour before dental appointments   Cholecalciferol (VITAMIN D3) 125 MCG (5000 UT) TABS Take 1  capsule by mouth 2 (two) times daily.    fluticasone (FLONASE) 50 MCG/ACT nasal spray Place 2 sprays into both nostrils daily.    glucose blood test strip 1 each by Other route 2 (two) times daily before a meal. Use as instructed   hydrALAZINE (APRESOLINE) 50 MG tablet Take 50 mg by mouth 2 (two) times daily.   hydrochlorothiazide (HYDRODIURIL) 25 MG tablet Take 25 mg by mouth daily.    insulin glargine (LANTUS) 100 UNIT/ML injection Inject 85 Units into the skin at bedtime.    JARDIANCE 25 MG TABS tablet Take 1 tablet by mouth daily.   olmesartan (BENICAR) 20 MG tablet Take 20 mg by mouth daily.   omeprazole (PRILOSEC) 20 MG capsule Take 20 mg by mouth 2 (two) times daily in the am and at bedtime..    rosuvastatin (CRESTOR) 5 MG tablet Take 1 tablet by mouth 3 (three) times a week.    spironolactone (ALDACTONE) 25 MG tablet Take 25 mg by mouth daily.   venlafaxine XR (EFFEXOR-XR) 150 MG 24 hr capsule Take 150 mg by mouth in the morning and at bedtime.     Allergies:  Allergies  Allergen Reactions   Vicodin [Hydrocodone-Acetaminophen] Itching    Facial swelling    Beta Adrenergic Blockers Other (See Comments)    Intolerance of the past   Dilaudid [Hydromorphone Hcl] Itching   Entex Lq [Phenylephrine-Guaifenesin] Hypertension   Hydromorphone Hcl Itching   Lipitor [Atorvastatin] Other (See Comments)    Leg pain   Percocet [Oxycodone-Acetaminophen] Itching   Protonix [Pantoprazole Sodium]    Sulfa Antibiotics Nausea Only   Wellbutrin [Bupropion] Other (See Comments)    Depression worsened   Erythromycin Other (See Comments)    Upset stomach   Metformin And Related Diarrhea   Morphine Rash   Morphine And Related Rash   Vasotec [Enalapril] Palpitations   Vicodin [Hydrocodone-Acetaminophen] Swelling and Rash    Social History   Socioeconomic History   Marital status: Married    Spouse name: Not on file   Number of children: 2   Years of education: Not on file   Highest  education level: Not on file  Occupational History   Not on file  Tobacco Use   Smoking status: Never   Smokeless tobacco: Never  Vaping Use   Vaping Use: Never used  Substance and Sexual Activity   Alcohol use: No   Drug use: No   Sexual activity: Not on file  Other Topics Concern  Not on file  Social History Narrative   Right handed   Social Determinants of Health   Financial Resource Strain: Not on file  Food Insecurity: Not on file  Transportation Needs: Not on file  Physical Activity: Not on file  Stress: Not on file  Social Connections: Not on file  Intimate Partner Violence: Not on file     Family History  Problem Relation Age of Onset   Diabetes Mother    Hypertension Mother    Cancer Father        liver   Diabetic kidney disease Sister    High blood pressure Sister    Hypercholesterolemia Sister    Stroke Sister        X2   Diabetes Brother    Cancer Brother    Diabetes Brother    Diabetes Son    Healthy Daughter      ROS:  Please see the history of present illness.     All other systems reviewed and negative.    Physical Exam: Blood pressure 118/63, pulse 82, height '5\' 4"'$  (1.626 m), weight 187 lb 3.2 oz (84.9 kg), SpO2 97 %. Repeat orthostatic blood pressures by me 145 lying---108 standing  General: Well developed, well nourished female in no acute distress. Head: Normocephalic, atraumatic, sclera non-icteric, no xanthomas, nares are without discharge. EENT: normal  Lymph Nodes:  none Neck: Negative for carotid bruits. JVD not elevated. Back:without scoliosis kyphosis Lungs: Clear bilaterally to auscultation without wheezes, rales, or rhonchi. Breathing is unlabored. Heart: RRR with S1 S2.  2/6 murmur . No rubs, or gallops appreciated. Significant RR variability with inspiration Abdomen: Soft, non-tender, non-distended with normoactive bowel sounds. No hepatomegaly. No rebound/guarding. No obvious abdominal masses. Msk:  Strength and tone  appear normal for age. Extremities: No clubbing or cyanosis. No  edema.  Distal pedal pulses are 2+ and equal bilaterally. Skin: Warm and Dry Neuro: Alert and oriented X 3. CN III-XII intact Grossly normal sensory and motor function . Psych:  Responds to questions appropriately with a normal affect.        EKG: Sinus at 79 Interval 17/07/39 V1-V2 Q waves question septal MI   Assessment and Plan:  Hypertension-labile  Orthostatic hypotension  Diabetes  Bilateral carpal tunnel  Aortic stenosis/sclerosis  Left retro-orbital headaches   The patient has systolic hypertension that is quite labile with blood pressures ranging now systolic [id number] over the span of a couple of weeks.  She also has orthostatic symptoms with demonstrable orthostatic hypotension with my measurements as noted above. In the context of her carpal tunnel, amyloid must be excluded, we will undertake a PYP scan.  Will also check an echocardiogram and this will clarify the source of her murmur which I suspect is aortic valve which also becomes an issue in the context of her amyloid.  Given her systolic supine hypertension which is also very labile may pharmacotherapies with things like pyridostigmine less appealing even with her history of orthostasis and presyncope.  We will start with compression wear of the abdomen and thighs.  Raise the head of the bed 4-6 inches.  And shower at night  Attempt to clarify whether good days and bad days can be identified with early a.m. blood pressure  With ongoing headaches particularly localized, will obtain head CT        [name]

## 2023-01-28 NOTE — Addendum Note (Signed)
Addended by: Oleta Mouse C on: 01/28/2023 01:37 PM   Modules accepted: Orders

## 2023-01-28 NOTE — Addendum Note (Signed)
Addended by: Gwendlyn Deutscher on: 01/28/2023 02:18 PM   Modules accepted: Orders

## 2023-01-29 ENCOUNTER — Encounter (HOSPITAL_COMMUNITY): Payer: Self-pay

## 2023-01-29 ENCOUNTER — Telehealth (HOSPITAL_COMMUNITY): Payer: Self-pay | Admitting: *Deleted

## 2023-01-30 ENCOUNTER — Ambulatory Visit (HOSPITAL_COMMUNITY): Payer: Medicare PPO | Attending: Cardiology

## 2023-01-30 DIAGNOSIS — E859 Amyloidosis, unspecified: Secondary | ICD-10-CM | POA: Diagnosis not present

## 2023-01-30 MED ORDER — TECHNETIUM TC 99M PYROPHOSPHATE
20.7000 | Freq: Once | INTRAVENOUS | Status: AC
  Administered 2023-01-30: 20.7 via INTRAVENOUS

## 2023-02-01 DIAGNOSIS — R42 Dizziness and giddiness: Secondary | ICD-10-CM | POA: Diagnosis not present

## 2023-02-01 DIAGNOSIS — I10 Essential (primary) hypertension: Secondary | ICD-10-CM | POA: Diagnosis not present

## 2023-02-01 DIAGNOSIS — Z794 Long term (current) use of insulin: Secondary | ICD-10-CM | POA: Diagnosis not present

## 2023-02-01 DIAGNOSIS — E1165 Type 2 diabetes mellitus with hyperglycemia: Secondary | ICD-10-CM | POA: Diagnosis not present

## 2023-02-01 DIAGNOSIS — E6609 Other obesity due to excess calories: Secondary | ICD-10-CM | POA: Diagnosis not present

## 2023-02-05 ENCOUNTER — Ambulatory Visit (HOSPITAL_COMMUNITY): Payer: Medicare PPO | Attending: Cardiovascular Disease

## 2023-02-05 DIAGNOSIS — E859 Amyloidosis, unspecified: Secondary | ICD-10-CM

## 2023-02-05 DIAGNOSIS — I10 Essential (primary) hypertension: Secondary | ICD-10-CM | POA: Diagnosis not present

## 2023-02-05 LAB — ECHOCARDIOGRAM COMPLETE
Area-P 1/2: 3.2 cm2
S' Lateral: 2 cm

## 2023-02-11 ENCOUNTER — Telehealth: Payer: Self-pay

## 2023-02-11 DIAGNOSIS — Z01812 Encounter for preprocedural laboratory examination: Secondary | ICD-10-CM

## 2023-02-11 DIAGNOSIS — R931 Abnormal findings on diagnostic imaging of heart and coronary circulation: Secondary | ICD-10-CM

## 2023-02-11 DIAGNOSIS — I517 Cardiomegaly: Secondary | ICD-10-CM

## 2023-02-11 NOTE — Telephone Encounter (Signed)
Spoke with pt and advised of echo results and recommendation for cMRI per Dr Caryl Comes.  Pt verbalizes understanding and agrees with current plan.

## 2023-02-11 NOTE — Telephone Encounter (Signed)
-----   Message from Deboraha Sprang, MD sent at 02/09/2023  9:59 AM EDT ----- Please Inform Patient Echo showed  normal heart muscle function with moderate asymmetric thickeing of the heart muscle wall   We should undertake cMRI to evaluate more specifically

## 2023-02-13 ENCOUNTER — Encounter: Payer: Self-pay | Admitting: Internal Medicine

## 2023-02-22 ENCOUNTER — Ambulatory Visit
Admission: RE | Admit: 2023-02-22 | Discharge: 2023-02-22 | Disposition: A | Discharge status: Home / Self Care | Payer: Medicare PPO | Source: Ambulatory Visit | Attending: Internal Medicine | Admitting: Internal Medicine

## 2023-02-22 DIAGNOSIS — R519 Headache, unspecified: Secondary | ICD-10-CM | POA: Diagnosis not present

## 2023-03-05 DIAGNOSIS — E113313 Type 2 diabetes mellitus with moderate nonproliferative diabetic retinopathy with macular edema, bilateral: Secondary | ICD-10-CM | POA: Diagnosis not present

## 2023-03-05 DIAGNOSIS — H40053 Ocular hypertension, bilateral: Secondary | ICD-10-CM | POA: Diagnosis not present

## 2023-03-05 DIAGNOSIS — H52203 Unspecified astigmatism, bilateral: Secondary | ICD-10-CM | POA: Diagnosis not present

## 2023-03-05 DIAGNOSIS — H524 Presbyopia: Secondary | ICD-10-CM | POA: Diagnosis not present

## 2023-03-05 DIAGNOSIS — H2513 Age-related nuclear cataract, bilateral: Secondary | ICD-10-CM | POA: Diagnosis not present

## 2023-03-06 DIAGNOSIS — I10 Essential (primary) hypertension: Secondary | ICD-10-CM | POA: Diagnosis not present

## 2023-03-06 DIAGNOSIS — H3582 Retinal ischemia: Secondary | ICD-10-CM | POA: Diagnosis not present

## 2023-03-06 DIAGNOSIS — H35372 Puckering of macula, left eye: Secondary | ICD-10-CM | POA: Diagnosis not present

## 2023-03-06 DIAGNOSIS — H25813 Combined forms of age-related cataract, bilateral: Secondary | ICD-10-CM | POA: Diagnosis not present

## 2023-03-06 DIAGNOSIS — E113313 Type 2 diabetes mellitus with moderate nonproliferative diabetic retinopathy with macular edema, bilateral: Secondary | ICD-10-CM | POA: Diagnosis not present

## 2023-03-14 ENCOUNTER — Ambulatory Visit: Payer: Medicare PPO | Admitting: Internal Medicine

## 2023-03-28 ENCOUNTER — Telehealth (HOSPITAL_COMMUNITY): Payer: Self-pay | Admitting: *Deleted

## 2023-03-28 NOTE — Telephone Encounter (Signed)
Reaching out to patient to offer assistance regarding upcoming cardiac imaging study; pt verbalizes understanding of appt date/time, parking situation and where to check in, and medications ordered, and verified current allergies; name and call back number provided for further questions should they arise  Larey Brick RN Navigator Cardiac Imaging [facility] [phone] office [phone] cell  Patient will take 1mg  ativan for her MRI and reports having a driver. She reports a knee replacement and breast marker.

## 2023-03-29 ENCOUNTER — Other Ambulatory Visit: Payer: Self-pay | Admitting: Internal Medicine

## 2023-03-29 ENCOUNTER — Ambulatory Visit (HOSPITAL_COMMUNITY)
Admission: RE | Admit: 2023-03-29 | Discharge: 2023-03-29 | Disposition: A | Discharge status: Home / Self Care | Payer: Medicare PPO | Source: Ambulatory Visit | Attending: Internal Medicine | Admitting: Internal Medicine

## 2023-03-29 DIAGNOSIS — I951 Orthostatic hypotension: Secondary | ICD-10-CM | POA: Insufficient documentation

## 2023-03-29 DIAGNOSIS — R931 Abnormal findings on diagnostic imaging of heart and coronary circulation: Secondary | ICD-10-CM

## 2023-03-29 DIAGNOSIS — I517 Cardiomegaly: Secondary | ICD-10-CM

## 2023-03-29 MED ORDER — GADOBUTROL 1 MMOL/ML IV SOLN
8.0000 mL | Freq: Once | INTRAVENOUS | Status: AC | PRN
  Administered 2023-03-29: 8 mL via INTRAVENOUS

## 2023-04-01 ENCOUNTER — Ambulatory Visit: Payer: Medicare PPO | Attending: Internal Medicine | Admitting: Internal Medicine

## 2023-04-01 ENCOUNTER — Encounter: Payer: Self-pay | Admitting: Internal Medicine

## 2023-04-01 VITALS — BP 119/67 | HR 91 | Ht 63.0 in | Wt 185.0 lb

## 2023-04-01 DIAGNOSIS — I951 Orthostatic hypotension: Secondary | ICD-10-CM | POA: Diagnosis not present

## 2023-04-01 NOTE — Patient Instructions (Signed)
Medication Instructions:  Your physician recommends that you continue on your current medications as directed. Please refer to the Current Medication list given to you today.  *If you need a refill on your cardiac medications before your next appointment, please call your pharmacy*   Lab Work: None ordered.  If you have labs (blood work) drawn today and your tests are completely normal, you will receive your results only by: MyChart Message (if you have MyChart) OR A paper copy in the mail If you have any lab test that is abnormal or we need to change your treatment, we will call you to review the results.   Testing/Procedures: None ordered.    Follow-Up: At [facility], you and your health needs are our priority.  As part of our continuing mission to provide you with exceptional heart care, we have created designated Provider Care Teams.  These Care Teams include your primary Cardiologist (physician) and Advanced Practice Providers (APPs -  Physician Assistants and Nurse Practitioners) who all work together to provide you with the care you need, when you need it.  We recommend signing up for the patient portal called "MyChart".  Sign up information is provided on this After Visit Summary.  MyChart is used to connect with patients for Virtual Visits (Telemedicine).  Patients are able to view lab/test results, encounter notes, upcoming appointments, etc.  Non-urgent messages can be sent to your provider as well.   To learn more about what you can do with MyChart, go to [email or web address].    Your next appointment:   4-6 weeks with Dr Graciela Husbands in the [facility] office

## 2023-04-01 NOTE — Progress Notes (Signed)
Patient Care Team: Thana Ates, MD as PCP - General (Internal Medicine)   HPI  [name] is a 72 y.o. female seen in follow-up for labile hypertension with some orthostatic hypotension.  Continues to have spells of weakness.  Somewhat unpredictable but mostly in the middle of the day  No chest pain or shortness of breath or peripheral edema    DATE TEST EF     3/24 PYP   Unlikely Amyloid    3/24 Echo  60-65% ASH 14/21mm  5/24 cMRI   LVH15mm T1 non cw/amyloid     Date Cr K Hgb  5/23 0.97 4.5 15.1               Records and Results Reviewed   Past Medical History:  Diagnosis Date   Arthritis    Background diabetic retinopathy (HCC)    Breast cancer (HCC) 1996   Right Breast Cancer   Cancer (HCC) 1996   breast    Chronic major depressive disorder, single episode    Depression    Diabetes mellitus    Diabetic retinopathy associated with diabetes mellitus due to underlying condition (HCC)    DJD (degenerative joint disease)    Drug-induced myopathy    Eczema    GERD (gastroesophageal reflux disease)    Hemorrhage of anus and rectum    HTN (hypertension)    Hyperlipidemia    Kidney stone 05/2012   Low back pain    Major depressive disorder, recurrent episode, in full remission (HCC)    Migraine    Orthostatic dizziness    Osteoarthritis of first carpometacarpal joint, unspecified    Osteoarthritis of knee, unspecified    Phobic anxiety disorder    Pure hypercholesterolemia    Stage 3a chronic kidney disease (HCC)    Vertigo    Vitamin D deficiency     Past Surgical History:  Procedure Laterality Date   APPENDECTOMY     BREAST RECONSTRUCTION     BREAST REDUCTION SURGERY     BREAST SURGERY     partial mastectomy   CERVICAL DISC SURGERY     CHOLECYSTECTOMY     FOREARM FRACTURE SURGERY     INCONTINENCE SURGERY     INSERTION OF TISSUE EXPANDER AFTER MASTECTOMY     JOINT REPLACEMENT     KNEE ARTHROSCOPY     MASTECTOMY Right 1996    NEUROPLASTY / TRANSPOSITION MEDIAN NERVE AT CARPAL TUNNEL BILATERAL     REDUCTION MAMMAPLASTY Left    1996   SPINE SURGERY     TOTAL KNEE ARTHROPLASTY     TUBAL LIGATION      Current Meds  Medication Sig   acetaminophen (QC ACETAMINOPHEN 8HR ARTH PAIN) 650 MG CR tablet Take 1,300 mg by mouth in the morning and at bedtime.   aspirin 81 MG tablet Take 1 tablet (81 mg total) by mouth daily.   Biotin 40981 MCG TABS Take 1 tablet by mouth daily.   calcium-vitamin D (OSCAL WITH D) 500-200 MG-UNIT per tablet Take 1 tablet by mouth.   celecoxib (CELEBREX) 100 MG capsule Take 100 mg by mouth 2 (two) times daily.   cephALEXin (KEFLEX) 500 MG capsule Take 500 mg by mouth. Take 4 tabs 1 hour before dental appointments   cetirizine (ZYRTEC) 10 MG tablet daily.   Cholecalciferol (VITAMIN D3) 125 MCG (5000 UT) TABS Take 1 capsule by mouth 2 (two) times daily.    fluticasone (FLONASE) 50 MCG/ACT  nasal spray Place 2 sprays into both nostrils daily.    glucose blood test strip 1 each by Other route 2 (two) times daily before a meal. Use as instructed   hydrALAZINE (APRESOLINE) 50 MG tablet Take 50 mg by mouth 2 (two) times daily.   insulin glargine (LANTUS) 100 UNIT/ML injection Inject 70 Units into the skin at bedtime.   olmesartan (BENICAR) 20 MG tablet Take 20 mg by mouth daily.   omeprazole (PRILOSEC) 20 MG capsule Take 20 mg by mouth 2 (two) times daily in the am and at bedtime.Marland Kitchen    OZEMPIC, 1 MG/DOSE, 4 MG/3ML SOPN once a week.   rosuvastatin (CRESTOR) 5 MG tablet Take 1 tablet by mouth 3 (three) times a week.    SODIUM FLUORIDE 5000 PPM 1.1 % PSTE Take by mouth at bedtime.   spironolactone (ALDACTONE) 25 MG tablet Take 25 mg by mouth daily.   tobramycin (TOBREX) 0.3 % ophthalmic solution Place into both eyes as directed. Before procedure   traZODone (DESYREL) 50 MG tablet as needed for sleep.   venlafaxine XR (EFFEXOR-XR) 150 MG 24 hr capsule Take 150 mg by mouth in the morning and at bedtime.      Allergies  Allergen Reactions   Vicodin [Hydrocodone-Acetaminophen] Itching    Facial swelling    Beta Adrenergic Blockers Other (See Comments)    Intolerance of the past   Dilaudid [Hydromorphone Hcl] Itching   Entex Lq [Phenylephrine-Guaifenesin] Hypertension   Hydromorphone Hcl Itching   Lipitor [Atorvastatin] Other (See Comments)    Leg pain   Percocet [Oxycodone-Acetaminophen] Itching   Protonix [Pantoprazole Sodium]    Sulfa Antibiotics Nausea Only   Telmisartan     Other Reaction(s): Unknown   Trulicity [Dulaglutide] Swelling    Tongue Swelling   Wellbutrin [Bupropion] Other (See Comments)    Depression worsened   Erythromycin Other (See Comments)    Upset stomach   Metformin And Related Diarrhea   Morphine Rash   Morphine And Related Rash   Vasotec [Enalapril] Palpitations   Vicodin [Hydrocodone-Acetaminophen] Swelling and Rash      Review of Systems negative except from HPI and PMH  Physical Exam BP 122/60   Pulse 88   Ht 5\' 3"  (1.6 m)   Wt 185 lb (83.9 kg)   SpO2 96%   BMI 32.77 kg/m  Well developed and well nourished in no acute distress HENT normal E scleral and icterus clear Neck Supple JVP flat; carotids brisk and full Clear to ausculation Regular rate and rhythm, no murmurs gallops or rub Soft with active bowel sounds No clubbing cyanosis  Edema Alert and oriented, grossly normal motor and sensory function Skin Warm and Dry  ECG sinus at 88 Interval 17/08/36  CrCl cannot be calculated (Patient's most recent lab result is older than the maximum 21 days allowed.).   Assessment and  Plan  Hypotension  Variable hypertension    Lengthy discussion regarding strategies to try to mitigate blood pressure changes.  She is already raise the head of her bed up.  Will have her take her blood pressures in the morning and at night and if her blood pressure is greater than 170, she will take hydralazine 75 otherwise she will take hydralazine 50.   Will also have her record blood pressures in the middle of the day as this seems to be the most challenging time and her blood pressure today in the mid day were also in the 120 range.  She might benefit from  an abdominal binder and/or ProAmatine.         Current medicines are reviewed at length with the patient today .  The patient does not   have concerns regarding medicines.

## 2023-04-03 ENCOUNTER — Telehealth: Payer: Self-pay | Admitting: Internal Medicine

## 2023-04-03 NOTE — Telephone Encounter (Signed)
-----   Message from Alois Cliche, RN sent at 04/01/2023  4:30 PM EDT ----- Regarding: 4-6 week appt in [place] Please call pt to schedule.  Thank You,  Mindi Junker

## 2023-04-03 NOTE — Telephone Encounter (Signed)
Left voicemail to schedule follow up appointment

## 2023-04-04 ENCOUNTER — Other Ambulatory Visit: Payer: Self-pay | Admitting: Internal Medicine

## 2023-04-04 DIAGNOSIS — Z1231 Encounter for screening mammogram for malignant neoplasm of breast: Secondary | ICD-10-CM

## 2023-04-16 ENCOUNTER — Encounter: Payer: Self-pay | Admitting: Internal Medicine

## 2023-04-16 NOTE — Telephone Encounter (Signed)
Error

## 2023-05-03 ENCOUNTER — Inpatient Hospital Stay (HOSPITAL_COMMUNITY): Admission: RE | Admit: 2023-05-03 | Payer: Medicare PPO | Source: Ambulatory Visit

## 2023-05-06 ENCOUNTER — Ambulatory Visit
Admission: RE | Admit: 2023-05-06 | Discharge: 2023-05-06 | Disposition: A | Discharge status: Home / Self Care | Payer: Medicare PPO | Source: Ambulatory Visit | Attending: Internal Medicine | Admitting: Internal Medicine

## 2023-05-06 DIAGNOSIS — Z1231 Encounter for screening mammogram for malignant neoplasm of breast: Secondary | ICD-10-CM

## 2023-05-08 ENCOUNTER — Other Ambulatory Visit: Payer: Self-pay | Admitting: Internal Medicine

## 2023-05-08 DIAGNOSIS — R928 Other abnormal and inconclusive findings on diagnostic imaging of breast: Secondary | ICD-10-CM

## 2023-05-11 ENCOUNTER — Ambulatory Visit
Admission: RE | Admit: 2023-05-11 | Discharge: 2023-05-11 | Disposition: A | Discharge status: Home / Self Care | Payer: Medicare PPO | Source: Ambulatory Visit | Attending: Internal Medicine | Admitting: Internal Medicine

## 2023-05-11 ENCOUNTER — Ambulatory Visit: Payer: Medicare PPO

## 2023-05-11 DIAGNOSIS — R921 Mammographic calcification found on diagnostic imaging of breast: Secondary | ICD-10-CM | POA: Diagnosis not present

## 2023-05-11 DIAGNOSIS — R928 Other abnormal and inconclusive findings on diagnostic imaging of breast: Secondary | ICD-10-CM

## 2023-05-17 DIAGNOSIS — H35372 Puckering of macula, left eye: Secondary | ICD-10-CM | POA: Diagnosis not present

## 2023-05-17 DIAGNOSIS — E113313 Type 2 diabetes mellitus with moderate nonproliferative diabetic retinopathy with macular edema, bilateral: Secondary | ICD-10-CM | POA: Diagnosis not present

## 2023-05-17 DIAGNOSIS — H25813 Combined forms of age-related cataract, bilateral: Secondary | ICD-10-CM | POA: Diagnosis not present

## 2023-05-17 DIAGNOSIS — I10 Essential (primary) hypertension: Secondary | ICD-10-CM | POA: Diagnosis not present

## 2023-05-17 DIAGNOSIS — H3582 Retinal ischemia: Secondary | ICD-10-CM | POA: Diagnosis not present

## 2023-05-23 DIAGNOSIS — E782 Mixed hyperlipidemia: Secondary | ICD-10-CM | POA: Diagnosis not present

## 2023-05-23 DIAGNOSIS — Z79899 Other long term (current) drug therapy: Secondary | ICD-10-CM | POA: Diagnosis not present

## 2023-05-23 DIAGNOSIS — E1165 Type 2 diabetes mellitus with hyperglycemia: Secondary | ICD-10-CM | POA: Diagnosis not present

## 2023-05-23 DIAGNOSIS — I10 Essential (primary) hypertension: Secondary | ICD-10-CM | POA: Diagnosis not present

## 2023-05-23 DIAGNOSIS — Z1331 Encounter for screening for depression: Secondary | ICD-10-CM | POA: Diagnosis not present

## 2023-05-23 DIAGNOSIS — Z0000 Encounter for general adult medical examination without abnormal findings: Secondary | ICD-10-CM | POA: Diagnosis not present

## 2023-05-23 DIAGNOSIS — E1140 Type 2 diabetes mellitus with diabetic neuropathy, unspecified: Secondary | ICD-10-CM | POA: Diagnosis not present

## 2023-05-23 DIAGNOSIS — F3342 Major depressive disorder, recurrent, in full remission: Secondary | ICD-10-CM | POA: Diagnosis not present

## 2023-05-23 DIAGNOSIS — E1136 Type 2 diabetes mellitus with diabetic cataract: Secondary | ICD-10-CM | POA: Diagnosis not present

## 2023-05-23 DIAGNOSIS — E113313 Type 2 diabetes mellitus with moderate nonproliferative diabetic retinopathy with macular edema, bilateral: Secondary | ICD-10-CM | POA: Diagnosis not present

## 2023-05-23 DIAGNOSIS — Z794 Long term (current) use of insulin: Secondary | ICD-10-CM | POA: Diagnosis not present

## 2023-05-23 DIAGNOSIS — M85852 Other specified disorders of bone density and structure, left thigh: Secondary | ICD-10-CM | POA: Diagnosis not present

## 2023-05-27 DIAGNOSIS — E875 Hyperkalemia: Secondary | ICD-10-CM | POA: Diagnosis not present

## 2023-05-31 ENCOUNTER — Other Ambulatory Visit: Payer: Self-pay | Admitting: Internal Medicine

## 2023-05-31 DIAGNOSIS — M85852 Other specified disorders of bone density and structure, left thigh: Secondary | ICD-10-CM

## 2023-06-03 ENCOUNTER — Ambulatory Visit: Payer: Medicare PPO | Admitting: Internal Medicine

## 2023-06-03 ENCOUNTER — Encounter: Payer: Self-pay | Admitting: Internal Medicine

## 2023-06-03 VITALS — BP 122/66 | HR 81 | Ht 63.0 in | Wt 179.4 lb

## 2023-06-03 DIAGNOSIS — I951 Orthostatic hypotension: Secondary | ICD-10-CM

## 2023-06-03 NOTE — Progress Notes (Signed)
Patient Care Team: Thana Ates, MD as PCP - General (Internal Medicine)   HPI  [name] is a 72 y.o. female seen in follow-up for labile hypertension with some orthostatic hypotension.  Continues with spells of profound lightheadedness and presyncope.  Also some dyspnea on exertion.    DATE TEST EF     3/24 PYP   Unlikely Amyloid    3/24 Echo  60-65% ASH 14/29mm  5/24 cMRI   LVH67mm T1 non cw/amyloid     Date Cr K Hgb  5/23 0.97 4.5 15.1               Records and Results Reviewed   Past Medical History:  Diagnosis Date   Arthritis    Background diabetic retinopathy (HCC)    Breast cancer (HCC) 1996   Right Breast Cancer   Cancer (HCC) 1996   breast    Chronic major depressive disorder, single episode    Depression    Diabetes mellitus    Diabetic retinopathy associated with diabetes mellitus due to underlying condition (HCC)    DJD (degenerative joint disease)    Drug-induced myopathy    Eczema    GERD (gastroesophageal reflux disease)    Hemorrhage of anus and rectum    HTN (hypertension)    Hyperlipidemia    Kidney stone 05/2012   Low back pain    Major depressive disorder, recurrent episode, in full remission (HCC)    Migraine    Orthostatic dizziness    Osteoarthritis of first carpometacarpal joint, unspecified    Osteoarthritis of knee, unspecified    Phobic anxiety disorder    Pure hypercholesterolemia    Stage 3a chronic kidney disease (HCC)    Vertigo    Vitamin D deficiency     Past Surgical History:  Procedure Laterality Date   APPENDECTOMY     BREAST RECONSTRUCTION     BREAST REDUCTION SURGERY     BREAST SURGERY     partial mastectomy   CERVICAL DISC SURGERY     CHOLECYSTECTOMY     FOREARM FRACTURE SURGERY     INCONTINENCE SURGERY     INSERTION OF TISSUE EXPANDER AFTER MASTECTOMY     JOINT REPLACEMENT     KNEE ARTHROSCOPY     MASTECTOMY Right 1996   NEUROPLASTY / TRANSPOSITION MEDIAN NERVE AT CARPAL  TUNNEL BILATERAL     REDUCTION MAMMAPLASTY Left    1996   SPINE SURGERY     TOTAL KNEE ARTHROPLASTY     TUBAL LIGATION      Current Meds  Medication Sig   acetaminophen (QC ACETAMINOPHEN 8HR ARTH PAIN) 650 MG CR tablet Take 1,300 mg by mouth in the morning and at bedtime.   aspirin 81 MG tablet Take 1 tablet (81 mg total) by mouth daily.   Biotin 16109 MCG TABS Take 1 tablet by mouth daily.   calcium-vitamin D (OSCAL WITH D) 500-200 MG-UNIT per tablet Take 1 tablet by mouth.   celecoxib (CELEBREX) 100 MG capsule Take 100 mg by mouth 2 (two) times daily.   cephALEXin (KEFLEX) 500 MG capsule Take 500 mg by mouth. Take 4 tabs 1 hour before dental appointments   cetirizine (ZYRTEC) 10 MG tablet daily.   Cholecalciferol (VITAMIN D3) 125 MCG (5000 UT) TABS Take 1 capsule by mouth 2 (two) times daily.    fluticasone (FLONASE) 50 MCG/ACT nasal spray Place 2 sprays into both nostrils daily.    glucose blood  test strip 1 each by Other route 2 (two) times daily before a meal. Use as instructed   hydrALAZINE (APRESOLINE) 50 MG tablet Take 50 mg by mouth 2 (two) times daily.   insulin glargine (LANTUS) 100 UNIT/ML injection Inject 70 Units into the skin at bedtime.   olmesartan (BENICAR) 20 MG tablet Take 20 mg by mouth daily.   omeprazole (PRILOSEC) 20 MG capsule Take 20 mg by mouth 2 (two) times daily in the am and at bedtime.Marland Kitchen    OZEMPIC, 2 MG/DOSE, 8 MG/3ML SOPN Inject into the skin. 2mg  weekly   rosuvastatin (CRESTOR) 5 MG tablet Take 1 tablet by mouth 3 (three) times a week.    SODIUM FLUORIDE 5000 PPM 1.1 % PSTE Take by mouth at bedtime.   spironolactone (ALDACTONE) 25 MG tablet Take 25 mg by mouth daily.   tobramycin (TOBREX) 0.3 % ophthalmic solution Place into both eyes as directed. Before procedure   traZODone (DESYREL) 50 MG tablet as needed for sleep.   venlafaxine XR (EFFEXOR-XR) 150 MG 24 hr capsule Take 150 mg by mouth in the morning and at bedtime.     Allergies  Allergen  Reactions   Vicodin [Hydrocodone-Acetaminophen] Itching    Facial swelling    Beta Adrenergic Blockers Other (See Comments)    Intolerance of the past   Dilaudid [Hydromorphone Hcl] Itching   Entex Lq [Phenylephrine-Guaifenesin] Hypertension   Hydromorphone Hcl Itching   Lipitor [Atorvastatin] Other (See Comments)    Leg pain   Percocet [Oxycodone-Acetaminophen] Itching   Protonix [Pantoprazole Sodium]    Sulfa Antibiotics Nausea Only   Telmisartan     Other Reaction(s): Unknown   Trulicity [Dulaglutide] Swelling    Tongue Swelling   Wellbutrin [Bupropion] Other (See Comments)    Depression worsened   Erythromycin Other (See Comments)    Upset stomach   Metformin And Related Diarrhea   Morphine Rash   Morphine And Codeine Rash   Vasotec [Enalapril] Palpitations   Vicodin [Hydrocodone-Acetaminophen] Swelling and Rash      Review of Systems negative except from HPI and PMH  Physical Exam BP 122/66 (Patient Position: Standing)   Pulse 81   Ht 5\' 3"  (1.6 m)   Wt 179 lb 6.4 oz (81.4 kg)   BMI 31.78 kg/m  Well developed and nourished in no acute distress HENT normal Neck supple with JVP-  flat Clear Regular rate and rhythm, no murmurs or gallops Abd-soft with active BS No Clubbing cyanosis edema Skin-warm and dry A & Oriented  Grossly normal sensory and motor function  ECG sinus at 81 Interval 17/09/37  CrCl cannot be calculated (Patient's most recent lab result is older than the maximum 21 days allowed.).   Assessment and  Plan  Orthostatic hypotension  ?  Autonomic insufficiency  Variable hypertension  Macroglossia       Patient has labile and supine hypertension.  Orthostatic hypotension without significant heart rate response suggesting underlying autonomic failure.  Could be related to diabetes but has fairly good sensation she says in her feet so may be idiopathic.  She also describes some swelling and pinkness of her tongue, acromegaly can be  associated with orthostatic hypotension and we will check an IGF-I level. Managing her orthostatic hypotension and trying to prevent falls will be challenging given her's labile supine hypertension.  Will have her take her olmesartan at night and her hydralazine at 50 mg at night.  In the morning, if her blood pressure is over 180, she will take  50 mg of hydralazine, less than 150 she will take 0 and in between she will take 25 mg.  Will see if this does not help , I have also recommended that she go by healthcare supply stores either [facility] or [facility] to see if she can be fitted for an abdominal binder.         Current medicines are reviewed at length with the patient today .  The patient does not   have concerns regarding medicines.

## 2023-06-03 NOTE — Patient Instructions (Signed)
Medication Instructions:  Your physician has recommended you make the following change in your medication:  Take Benicar at bedtime Hydralazine -  take 50 mg twice daily     Take 50 mg every night, in the mornings use the following guidelines:      If SBP is 180 or greater, take 50 mg      If SBP is between 150 - 180, take 25 mg      If SBP is 150 or less, do not take any hydralazine at all  *If you need a refill on your cardiac medications before your next appointment, please call your pharmacy*   Lab Work: Today:  Insulin like growth factor lab If you have labs (blood work) drawn today and your tests are completely normal, you will receive your results only by: MyChart Message (if you have MyChart) OR A paper copy in the mail If you have any lab test that is abnormal or we need to change your treatment, we will call you to review the results.   Testing/Procedures: None ordered   Follow-Up: At [facility], you and your health needs are our priority.  As part of our continuing mission to provide you with exceptional heart care, we have created designated Provider Care Teams.  These Care Teams include your primary Cardiologist (physician) and Advanced Practice Providers (APPs -  Physician Assistants and Nurse Practitioners) who all work together to provide you with the care you need, when you need it.  Your next appointment:   3 month(s)  The format for your next appointment:   In Person  Provider:   Francis Dowse, PA-C{  Thank you for choosing [facility]!!   [phone]

## 2023-06-04 LAB — INSULIN-LIKE GROWTH FACTOR: Insulin-Like GF-1: 114 ng/mL (ref 48–191)

## 2023-06-09 ENCOUNTER — Other Ambulatory Visit: Payer: Self-pay

## 2023-06-09 ENCOUNTER — Emergency Department (HOSPITAL_COMMUNITY)
Admission: EM | Admit: 2023-06-09 | Discharge: 2023-06-09 | Disposition: A | Discharge status: Home / Self Care | Payer: Medicare PPO | Attending: Emergency Medicine | Admitting: Emergency Medicine

## 2023-06-09 ENCOUNTER — Encounter (HOSPITAL_COMMUNITY): Payer: Self-pay | Admitting: Emergency Medicine

## 2023-06-09 ENCOUNTER — Emergency Department (HOSPITAL_COMMUNITY): Payer: Medicare PPO

## 2023-06-09 DIAGNOSIS — N189 Chronic kidney disease, unspecified: Secondary | ICD-10-CM | POA: Insufficient documentation

## 2023-06-09 DIAGNOSIS — R110 Nausea: Secondary | ICD-10-CM | POA: Diagnosis not present

## 2023-06-09 DIAGNOSIS — Z794 Long term (current) use of insulin: Secondary | ICD-10-CM | POA: Diagnosis not present

## 2023-06-09 DIAGNOSIS — E1122 Type 2 diabetes mellitus with diabetic chronic kidney disease: Secondary | ICD-10-CM | POA: Diagnosis not present

## 2023-06-09 DIAGNOSIS — R12 Heartburn: Secondary | ICD-10-CM | POA: Insufficient documentation

## 2023-06-09 DIAGNOSIS — R079 Chest pain, unspecified: Secondary | ICD-10-CM

## 2023-06-09 DIAGNOSIS — Z853 Personal history of malignant neoplasm of breast: Secondary | ICD-10-CM | POA: Insufficient documentation

## 2023-06-09 DIAGNOSIS — Z7982 Long term (current) use of aspirin: Secondary | ICD-10-CM | POA: Insufficient documentation

## 2023-06-09 DIAGNOSIS — R0789 Other chest pain: Secondary | ICD-10-CM | POA: Diagnosis not present

## 2023-06-09 DIAGNOSIS — R0602 Shortness of breath: Secondary | ICD-10-CM | POA: Insufficient documentation

## 2023-06-09 LAB — CBC
HCT: 36.7 % (ref 36.0–46.0)
Hemoglobin: 12.3 g/dL (ref 12.0–15.0)
MCH: 30.8 pg (ref 26.0–34.0)
MCHC: 33.5 g/dL (ref 30.0–36.0)
MCV: 92 fL (ref 80.0–100.0)
Platelets: 262 10*3/uL (ref 150–400)
RBC: 3.99 MIL/uL (ref 3.87–5.11)
RDW: 12.1 % (ref 11.5–15.5)
WBC: 8.6 10*3/uL (ref 4.0–10.5)
nRBC: 0 % (ref 0.0–0.2)

## 2023-06-09 LAB — BASIC METABOLIC PANEL
Anion gap: 9 (ref 5–15)
BUN: 22 mg/dL (ref 8–23)
CO2: 26 mmol/L (ref 22–32)
Calcium: 9.7 mg/dL (ref 8.9–10.3)
Chloride: 100 mmol/L (ref 98–111)
Creatinine, Ser: 0.86 mg/dL (ref 0.44–1.00)
GFR, Estimated: >60 mL/min (ref >60)
Glucose, Bld: 193 mg/dL — ABNORMAL HIGH (ref 70–99)
Potassium: 4.8 mmol/L (ref 3.5–5.1)
Sodium: 135 mmol/L (ref 135–145)

## 2023-06-09 LAB — TROPONIN I (HIGH SENSITIVITY)
Troponin I (High Sensitivity): 5 ng/L (ref <18)
Troponin I (High Sensitivity): 4 ng/L (ref <18)

## 2023-06-09 MED ORDER — FAMOTIDINE IN NACL 20-0.9 MG/50ML-% IV SOLN
20.0000 mg | Freq: Once | INTRAVENOUS | Status: AC
  Administered 2023-06-09: 20 mg via INTRAVENOUS
  Filled 2023-06-09: qty 50

## 2023-06-09 NOTE — ED Provider Notes (Signed)
EMERGENCY DEPARTMENT AT [facility] Provider Note   CSN: [id number] Arrival date & time: 06/09/23  1707     History  Chief Complaint  Patient presents with   Chest Pain    [name] is a 72 y.o. female.  She has a history of breath cancer diabetes orthostatic blood pressure her CKD.  She said she was sitting about an hour ago at home when she began experiencing what she thought was reflux.  It progressively got worse over a few minutes to 8 out of 10 chest pressure heaviness.  Associated with some shortness of breath and diaphoresis little bit and nausea.  She called 911, took 4 baby aspirin.  They did an EKG and recommended she come here for further evaluation.  She said her symptoms are improved and now she rates it as 2 out of 10.  No prior history of same.  No known cardiac disease although does see a cardiologist for her blood pressure and has a murmur.  The history is provided by the patient and the spouse.  Chest Pain Pain location:  Substernal area Pain quality: dull and pressure   Pain radiates to:  Does not radiate Pain severity:  Severe Onset quality:  Gradual Duration:  1 hour Timing:  Constant Progression:  Improving Chronicity:  New Context: at rest   Relieved by:  Nothing Worsened by:  Nothing Ineffective treatments:  Aspirin Associated symptoms: diaphoresis, heartburn, nausea and shortness of breath   Associated symptoms: no cough, no dizziness, no fever, no numbness and no vomiting   Risk factors: diabetes mellitus and hypertension   Risk factors: no smoking        Home Medications Prior to Admission medications   Medication Sig Start Date End Date Taking? Authorizing Provider  acetaminophen (QC ACETAMINOPHEN 8HR ARTH PAIN) 650 MG CR tablet Take 1,300 mg by mouth in the morning and at bedtime.    [provider]  aspirin 81 MG tablet Take 1 tablet (81 mg total) by mouth daily. 06/25/12   Jerilee Field, MD   Biotin 82956 MCG TABS Take 1 tablet by mouth daily.    [provider]  calcium-vitamin D (OSCAL WITH D) 500-200 MG-UNIT per tablet Take 1 tablet by mouth.    [provider]  celecoxib (CELEBREX) 100 MG capsule Take 100 mg by mouth 2 (two) times daily. 05/07/22   [provider]  cephALEXin (KEFLEX) 500 MG capsule Take 500 mg by mouth. Take 4 tabs 1 hour before dental appointments    [provider]  cetirizine (ZYRTEC) 10 MG tablet daily.    [provider]  Cholecalciferol (VITAMIN D3) 125 MCG (5000 UT) TABS Take 1 capsule by mouth 2 (two) times daily.     [provider]  fluticasone (FLONASE) 50 MCG/ACT nasal spray Place 2 sprays into both nostrils daily.  06/03/19   [provider]  glucose blood test strip 1 each by Other route 2 (two) times daily before a meal. Use as instructed    [provider]  hydrALAZINE (APRESOLINE) 50 MG tablet Take 50 mg by mouth 2 (two) times daily. 01/10/23   [provider]  insulin glargine (LANTUS) 100 UNIT/ML injection Inject 70 Units into the skin at bedtime.    [provider]  olmesartan (BENICAR) 20 MG tablet Take 20 mg by mouth daily. 12/09/22   [provider]  omeprazole (PRILOSEC) 20 MG capsule Take 20 mg by mouth 2 (two) times  daily in the am and at bedtime..     [provider]  OZEMPIC, 2 MG/DOSE, 8 MG/3ML SOPN Inject into the skin. 2mg  weekly 05/31/23   [provider]  rosuvastatin (CRESTOR) 5 MG tablet Take 1 tablet by mouth 3 (three) times a week.  04/30/20   [provider]  SODIUM FLUORIDE 5000 PPM 1.1 % PSTE Take by mouth at bedtime. 11/01/22   [provider]  spironolactone (ALDACTONE) 25 MG tablet Take 25 mg by mouth daily. 04/11/22   [provider]  tobramycin (TOBREX) 0.3 % ophthalmic solution Place into both eyes as directed. Before procedure 07/05/21   [provider]  traZODone (DESYREL) 50 MG  tablet as needed for sleep. 01/30/22   [provider]  venlafaxine XR (EFFEXOR-XR) 150 MG 24 hr capsule Take 150 mg by mouth in the morning and at bedtime.  05/04/20   [provider]      Allergies    Vicodin [hydrocodone-acetaminophen], Beta adrenergic blockers, Dilaudid [hydromorphone hcl], Entex lq [phenylephrine-guaifenesin], Hydromorphone hcl, Lipitor [atorvastatin], Percocet [oxycodone-acetaminophen], Protonix [pantoprazole sodium], Sulfa antibiotics, Telmisartan, Trulicity [dulaglutide], Wellbutrin [bupropion], Erythromycin, Metformin and related, Morphine, Morphine and codeine, Vasotec [enalapril], and Vicodin [hydrocodone-acetaminophen]    Review of Systems   Review of Systems  Constitutional:  Positive for diaphoresis. Negative for fever.  Respiratory:  Positive for shortness of breath. Negative for cough.   Cardiovascular:  Positive for chest pain.  Gastrointestinal:  Positive for heartburn and nausea. Negative for vomiting.  Neurological:  Negative for dizziness and numbness.    Physical Exam Updated Vital Signs BP (!) 140/60   Pulse 89   Temp 98.6 F (37 C) (Oral)   Resp 18   Ht 5\' 3"  (1.6 m)   Wt 81.2 kg   SpO2 96%   BMI 31.71 kg/m  Physical Exam Vitals and nursing note reviewed.  Constitutional:      General: She is not in acute distress.    Appearance: Normal appearance. She is well-developed.  HENT:     Head: Normocephalic and atraumatic.  Eyes:     Conjunctiva/sclera: Conjunctivae normal.  Cardiovascular:     Rate and Rhythm: Normal rate and regular rhythm.     Heart sounds: Murmur heard.  Pulmonary:     Effort: Pulmonary effort is normal. No respiratory distress.     Breath sounds: Normal breath sounds.  Abdominal:     Palpations: Abdomen is soft.     Tenderness: There is no abdominal tenderness.  Musculoskeletal:        General: No swelling.     Cervical back: Neck supple.     Right lower leg: No tenderness. No edema.     Left  lower leg: No tenderness. No edema.  Skin:    General: Skin is warm and dry.     Capillary Refill: Capillary refill takes less than 2 seconds.  Neurological:     General: No focal deficit present.     Mental Status: She is alert.     Sensory: No sensory deficit.     Motor: No weakness.     ED Results / Procedures / Treatments   Labs (all labs ordered are listed, but only abnormal results are displayed) Labs Reviewed  BASIC METABOLIC PANEL - Abnormal; Notable for the following components:      Result Value   Glucose, Bld 193 (*)    All other components within normal limits  CBC  TROPONIN I (HIGH SENSITIVITY)  TROPONIN I (HIGH  SENSITIVITY)    EKG EKG Interpretation Date/Time:  Sunday June 09 2023 18:15:24 EDT Ventricular Rate:  77 PR Interval:  181 QRS Duration:  83 QT Interval:  379 QTC Calculation: 429 R Axis:   72  Text Interpretation: Sinus rhythm Low voltage, precordial leads No significant change since Since last tracing of earlier today Confirmed by Meridee Score [phone]) on 06/09/2023 6:47:42 PM  Radiology DG Chest Port 1 View  Result Date: 06/09/2023 CLINICAL DATA:  Chest pain EXAM: PORTABLE CHEST 1 VIEW COMPARISON:  02/25/2019 FINDINGS: Calcified right breast implant. No acute airspace disease or effusion. Normal cardiac size. No pneumothorax IMPRESSION: No active disease. Electronically Signed   By: Jasmine Pang M.D.   On: 06/09/2023 18:51    Procedures Procedures    Medications Ordered in ED Medications  famotidine (PEPCID) IVPB 20 mg premix (has no administration in time range)    ED Course/ Medical Decision Making/ A&P Clinical Course as of 06/10/23 6962  Wynelle Link Jun 09, 2023  1848 EKG not crossing in epic normal sinus rhythm low voltage no acute ST-T changes. [MB]    Clinical Course User Index [MB] Terrilee Files, MD                             Medical Decision Making Amount and/or Complexity of Data Reviewed Labs: ordered. Radiology:  ordered.  Risk Prescription drug management.   This patient complains of chest pain diaphoresis; this involves an extensive number of treatment Options and is a complaint that carries with it a high risk of complications and morbidity. The differential includes ACS, pneumonia, pneumothorax, PE, vascular, reflux  I ordered, reviewed and interpreted labs, which included CBC normal chemistries normal other than elevated glucose troponins flat I ordered medication IV Pepcid and reviewed PMP when indicated. I ordered imaging studies which included chest x-ray and I independently    visualized and interpreted imaging which showed no acute findings Additional history obtained from patient's husband  previous records obtained and reviewed  in epic including recent cardiology notes from last week Cardiac monitoring reviewed, normal sinus rhythm Social determinants considered, no significant barriers Critical Interventions: None  After the interventions stated above, I reevaluated the patient and found patient's pain had spontaneously resolved pretty much by the time she got to the department.  After Pepcid she was completely pain-free.  Delta troponins negative. Admission and further testing considered, no indications for admission or further workup at this time.  She is asymptomatic at this time.  Recommended close follow-up with her PCP and cardiology team.  Return instructions discussed         Final Clinical Impression(s) / ED Diagnoses Final diagnoses:  Nonspecific chest pain    Rx / DC Orders ED Discharge Orders     None         Terrilee Files, MD 06/10/23 [phone]

## 2023-06-09 NOTE — Discharge Instructions (Signed)
You were seen in the emergency department for an episode of chest pain.  You had blood work EKG and chest x-ray that did not show an obvious cause of your symptoms.  Will be important for you to follow-up with your primary care doctor so they can decide if you need further testing.  Please continue regular medications.  Return to the emergency department if any worsening or concerning symptoms.

## 2023-06-09 NOTE — ED Triage Notes (Signed)
Pt via POV c/o central nonradiating, crushing chest pain earlier today; she called 911 and was advised to come to ER for labs to rule out a cardiac event. Pt was diaphoretic and nauseated with gasping breaths, but she states her symptoms have since resolved.

## 2023-06-14 DIAGNOSIS — F419 Anxiety disorder, unspecified: Secondary | ICD-10-CM | POA: Diagnosis not present

## 2023-06-14 DIAGNOSIS — R079 Chest pain, unspecified: Secondary | ICD-10-CM | POA: Diagnosis not present

## 2023-06-14 DIAGNOSIS — K219 Gastro-esophageal reflux disease without esophagitis: Secondary | ICD-10-CM | POA: Diagnosis not present

## 2023-07-03 ENCOUNTER — Ambulatory Visit
Admission: RE | Admit: 2023-07-03 | Discharge: 2023-07-03 | Disposition: A | Discharge status: Home / Self Care | Payer: Medicare PPO | Source: Ambulatory Visit | Attending: Internal Medicine | Admitting: Internal Medicine

## 2023-07-03 DIAGNOSIS — E349 Endocrine disorder, unspecified: Secondary | ICD-10-CM | POA: Diagnosis not present

## 2023-07-03 DIAGNOSIS — M8588 Other specified disorders of bone density and structure, other site: Secondary | ICD-10-CM | POA: Diagnosis not present

## 2023-07-03 DIAGNOSIS — N958 Other specified menopausal and perimenopausal disorders: Secondary | ICD-10-CM | POA: Diagnosis not present

## 2023-07-03 DIAGNOSIS — M85852 Other specified disorders of bone density and structure, left thigh: Secondary | ICD-10-CM

## 2023-07-03 DIAGNOSIS — E119 Type 2 diabetes mellitus without complications: Secondary | ICD-10-CM | POA: Diagnosis not present

## 2023-07-11 ENCOUNTER — Ambulatory Visit: Payer: Medicare PPO | Admitting: Internal Medicine

## 2023-07-16 DIAGNOSIS — Z79899 Other long term (current) drug therapy: Secondary | ICD-10-CM | POA: Diagnosis not present

## 2023-07-29 ENCOUNTER — Encounter (INDEPENDENT_AMBULATORY_CARE_PROVIDER_SITE_OTHER): Payer: Self-pay

## 2023-07-30 DIAGNOSIS — E113313 Type 2 diabetes mellitus with moderate nonproliferative diabetic retinopathy with macular edema, bilateral: Secondary | ICD-10-CM | POA: Diagnosis not present

## 2023-07-30 DIAGNOSIS — I10 Essential (primary) hypertension: Secondary | ICD-10-CM | POA: Diagnosis not present

## 2023-07-30 DIAGNOSIS — H25813 Combined forms of age-related cataract, bilateral: Secondary | ICD-10-CM | POA: Diagnosis not present

## 2023-07-30 DIAGNOSIS — H35372 Puckering of macula, left eye: Secondary | ICD-10-CM | POA: Diagnosis not present

## 2023-07-30 DIAGNOSIS — H3582 Retinal ischemia: Secondary | ICD-10-CM | POA: Diagnosis not present

## 2023-07-31 DIAGNOSIS — E875 Hyperkalemia: Secondary | ICD-10-CM | POA: Diagnosis not present

## 2023-08-31 NOTE — Progress Notes (Unsigned)
Cardiology Office Note:  .   Date:  08/31/2023  ID:  [name], DOB July 18, 1951, MRN [id number] PCP: Diana Ates, MD  [facility] Providers Cardiologist:  None Electrophysiologist:  Diana Manges, MD {  History of Present Illness: .   [name] is a 72 y.o. female w/PMHx of breast cancer (s/p partial mastectomy, R)DM (w/diabetic retinopathy), HTN, HLD, CKD (III)  She saw Dr. Graciela Davis 06/03/23, reported labile HTN and orthostatic hypotension with ongoing episodes of profound lightheadedness and presyncope.  Also some DOE. W/u included: DATE TEST EF     3/24 PYP   Unlikely Amyloid    3/24 Echo  60-65% ASH 14/87mm  5/24 cMRI    LVH17mm T1 non cw/amyloid   "Could be related to diabetes but has fairly good sensation she says in her feet so may be idiopathic.  She also describes some swelling and pinkness of her tongue, acromegaly can be associated with orthostatic hypotension and we will check an IGF-I level. Managing her orthostatic hypotension and trying to prevent falls will be challenging given her's labile supine hypertension.  Will have her take her olmesartan at night and her hydralazine at 50 mg at night." Advised: Abdominal binder and In the morning, if her blood pressure is over 180, she will take 50 mg of hydralazine less than 150 she will take 0  and in between she will take 25 mg.   (IGF was wnl)  ER visit 06/09/23 w/CP, better by the time she was seen, resolved after pepcid Delta trop neg Discharged from the ER  Today's visit is scheduled as a 3 mo visit  ROS: ***  *** symptoms *** BP's  *** DM?   Studies Reviewed: Marland Kitchen    EKG not done today   Risk Assessment/Calculations:    Physical Exam:   VS:  There were no vitals taken for this visit.   Wt Readings from Last 3 Encounters:  06/09/23 179 lb (81.2 kg)  06/03/23 179 lb 6.4 oz (81.4 kg)  04/01/23 185 lb (83.9 kg)    GEN: Well nourished, well developed in no acute  distress NECK: No JVD; No carotid bruits CARDIAC: ***RRR, no murmurs, rubs, gallops RESPIRATORY:  *** CTA b/l without rales, wheezing or rhonchi  ABDOMEN: Soft, non-tender, non-distended EXTREMITIES:  No edema; No deformity    ASSESSMENT AND PLAN: .    HTN Orthostatic hypotension ? Dysautonomia ***     {Are you ordering a CV Procedure (e.g. stress test, cath, DCCV, TEE, etc)?   Press F2        :[id number]}     Dispo: ***  Signed, Sheilah Pigeon, PA-C

## 2023-09-03 ENCOUNTER — Encounter: Payer: Self-pay | Admitting: Physician Assistant

## 2023-09-03 ENCOUNTER — Ambulatory Visit: Payer: Medicare PPO | Attending: Physician Assistant | Admitting: Physician Assistant

## 2023-09-03 VITALS — BP 146/80 | HR 82 | Ht 63.0 in | Wt 187.4 lb

## 2023-09-03 DIAGNOSIS — G901 Familial dysautonomia [Riley-Day]: Secondary | ICD-10-CM

## 2023-09-03 DIAGNOSIS — I951 Orthostatic hypotension: Secondary | ICD-10-CM

## 2023-09-03 DIAGNOSIS — I10 Essential (primary) hypertension: Secondary | ICD-10-CM | POA: Diagnosis not present

## 2023-09-03 NOTE — Patient Instructions (Signed)
Medication Instructions:   Your physician recommends that you continue on your current medications as directed. Please refer to the Current Medication list given to you today.  *If you need a refill on your cardiac medications before your next appointment, please call your pharmacy*   Lab Work: NONE ORDERED  TODAY   If you have labs (blood work) drawn today and your tests are completely normal, you will receive your results only by: MyChart Message (if you have MyChart) OR A paper copy in the mail If you have any lab test that is abnormal or we need to change your treatment, we will call you to review the results.   Testing/Procedures: NONE ORDERED  TODAY     Follow-Up: At [facility], you and your health needs are our priority.  As part of our continuing mission to provide you with exceptional heart care, we have created designated Provider Care Teams.  These Care Teams include your primary Cardiologist (physician) and Advanced Practice Providers (APPs -  Physician Assistants and Nurse Practitioners) who all work together to provide you with the care you need, when you need it.  We recommend signing up for the patient portal called "MyChart".  Sign up information is provided on this After Visit Summary.  MyChart is used to connect with patients for Virtual Visits (Telemedicine).  Patients are able to view lab/test results, encounter notes, upcoming appointments, etc.  Non-urgent messages can be sent to your provider as well.   To learn more about what you can do with MyChart, go to [email or web address].    Your next appointment:   3 month(s) ( CONTACT  CASSIE HALL/ ANGELINE HAMMER FOR EP SCHEDULING ISSUES )   Provider:   You may see Sherryl Manges, MD or one of the following Advanced Practice Providers on your designated Care Team:   Francis Dowse, [place]   Other Instructions

## 2023-09-10 DIAGNOSIS — H40053 Ocular hypertension, bilateral: Secondary | ICD-10-CM | POA: Diagnosis not present

## 2023-09-17 ENCOUNTER — Ambulatory Visit: Payer: Medicare PPO | Admitting: Internal Medicine

## 2023-09-18 ENCOUNTER — Ambulatory Visit: Payer: Medicare PPO | Admitting: Cardiology

## 2023-09-19 DIAGNOSIS — E1165 Type 2 diabetes mellitus with hyperglycemia: Secondary | ICD-10-CM | POA: Diagnosis not present

## 2023-09-19 DIAGNOSIS — I10 Essential (primary) hypertension: Secondary | ICD-10-CM | POA: Diagnosis not present

## 2023-09-19 DIAGNOSIS — R42 Dizziness and giddiness: Secondary | ICD-10-CM | POA: Diagnosis not present

## 2023-09-19 DIAGNOSIS — E6609 Other obesity due to excess calories: Secondary | ICD-10-CM | POA: Diagnosis not present

## 2023-09-19 DIAGNOSIS — Z794 Long term (current) use of insulin: Secondary | ICD-10-CM | POA: Diagnosis not present

## 2023-09-24 DIAGNOSIS — E113313 Type 2 diabetes mellitus with moderate nonproliferative diabetic retinopathy with macular edema, bilateral: Secondary | ICD-10-CM | POA: Diagnosis not present

## 2023-09-24 DIAGNOSIS — I10 Essential (primary) hypertension: Secondary | ICD-10-CM | POA: Diagnosis not present

## 2023-09-24 DIAGNOSIS — H25813 Combined forms of age-related cataract, bilateral: Secondary | ICD-10-CM | POA: Diagnosis not present

## 2023-09-24 DIAGNOSIS — H3582 Retinal ischemia: Secondary | ICD-10-CM | POA: Diagnosis not present

## 2023-09-24 DIAGNOSIS — H35372 Puckering of macula, left eye: Secondary | ICD-10-CM | POA: Diagnosis not present

## 2023-11-26 DIAGNOSIS — H3582 Retinal ischemia: Secondary | ICD-10-CM | POA: Diagnosis not present

## 2023-11-26 DIAGNOSIS — H35372 Puckering of macula, left eye: Secondary | ICD-10-CM | POA: Diagnosis not present

## 2023-11-26 DIAGNOSIS — E113313 Type 2 diabetes mellitus with moderate nonproliferative diabetic retinopathy with macular edema, bilateral: Secondary | ICD-10-CM | POA: Diagnosis not present

## 2023-11-26 DIAGNOSIS — I10 Essential (primary) hypertension: Secondary | ICD-10-CM | POA: Diagnosis not present

## 2023-11-26 DIAGNOSIS — H25813 Combined forms of age-related cataract, bilateral: Secondary | ICD-10-CM | POA: Diagnosis not present

## 2023-12-01 NOTE — Progress Notes (Signed)
  Cardiology Office Note:  .   Date:  12/01/2023  ID:  [name], DOB February 16, 1951, MRN [id number] PCP: Dwight Trula SQUIBB, MD  [facility] Providers Cardiologist:  None Electrophysiologist:  Elspeth Sage, MD {  History of Present Illness: .   [name] is a 73 y.o. female w/PMHx of breast cancer (s/p partial mastectomy, DM (w/diabetic retinopathy), HTN, HLD, CKD (III)  She saw Dr. Sage 06/03/23, reported labile HTN and orthostatic hypotension with ongoing episodes of profound lightheadedness and presyncope.  Also some DOE. W/u included: DATE TEST EF     3/24 PYP   Unlikely Amyloid    3/24 Echo  60-65% ASH 14/73mm  5/24 cMRI    LVH75mm T1 non cw/amyloid   Could be related to diabetes but has fairly good sensation she says in her feet so may be idiopathic.  She also describes some swelling and pinkness of her tongue, acromegaly can be associated with orthostatic hypotension and we will check an IGF-I level. Managing her orthostatic hypotension and trying to prevent falls will be challenging given her's labile supine hypertension.  Will have her take her olmesartan  at night and her hydralazine at 50 mg at night. Advised: Abdominal binder and In the morning, if her blood pressure is over 180, she will take 50 mg of hydralazine less than 150 she will take 0  and in between she will take 25 mg.   (IGF was wnl)  ER visit 06/09/23 w/CP, better by the time she was seen, resolved after pepcid  Delta trop neg Discharged from the ER  I saw her Oct 2024 She comes today accompanied by her husband. Since the medication parameters with Dr. Sage she has been doing really well.  Has made  all the difference in the world! In hindsight she thinks the ER visit symptoms were a panic attack. She has not had any kind of CP, SOB again No dizzy spells, near syncope or syncope. She gets her ADLs done, though no formal exercise Planned for another 3 mo visit  Today's visit is  scheduled as a 3 mo visit  ROS:   She continues to do very well! BPs remains stable No near syncope or syncope No CP since that ER visit, no SOB Not particularly active, cares for her home, no exercise, has her she shed she will be getting after/picked up and organized, will get her moving again.  Studies Reviewed: SABRA    EKG not done today   Risk Assessment/Calculations:    Physical Exam:   VS:  There were no vitals taken for this visit.   Wt Readings from Last 3 Encounters:  09/03/23 187 lb 6.4 oz (85 kg)  06/09/23 179 lb (81.2 kg)  06/03/23 179 lb 6.4 oz (81.4 kg)    GEN: Well nourished, well developed in no acute distress NECK: No JVD; No carotid bruits CARDIAC: RRR, no murmurs, rubs, gallops RESPIRATORY:  CTA b/l without rales, wheezing or rhonchi  ABDOMEN: Soft, non-tender, non-distended EXTREMITIES:  No edema; No deformity    ASSESSMENT AND PLAN: .    HTN Orthostatic hypotension ? Dysautonomia No recurrent syncope or near syncope with current regime    Dispo: she is happy to move her visits out, back in 73mo again, sooner if needed    Signed, Charlies Macario Arthur, PA-C

## 2023-12-04 ENCOUNTER — Ambulatory Visit: Payer: Medicare PPO | Attending: Physician Assistant | Admitting: Physician Assistant

## 2023-12-04 ENCOUNTER — Encounter: Payer: Self-pay | Admitting: Physician Assistant

## 2023-12-04 VITALS — BP 138/66 | HR 89 | Ht 63.5 in | Wt 191.4 lb

## 2023-12-04 DIAGNOSIS — I951 Orthostatic hypotension: Secondary | ICD-10-CM

## 2023-12-04 DIAGNOSIS — I10 Essential (primary) hypertension: Secondary | ICD-10-CM

## 2023-12-04 NOTE — Patient Instructions (Signed)
 Medication Instructions:    Your physician recommends that you continue on your current medications as directed. Please refer to the Current Medication list given to you today.   *If you need a refill on your cardiac medications before your next appointment, please call your pharmacy*   Lab Work: NONE ORDERED  TODAY    If you have labs (blood work) drawn today and your tests are completely normal, you will receive your results only by: MyChart Message (if you have MyChart) OR A paper copy in the mail If you have any lab test that is abnormal or we need to change your treatment, we will call you to review the results.   Testing/Procedures:NONE ORDERED  TODAY      Follow-Up: At [facility], you and your health needs are our priority.  As part of our continuing mission to provide you with exceptional heart care, we have created designated Provider Care Teams.  These Care Teams include your primary Cardiologist (physician) and Advanced Practice Providers (APPs -  Physician Assistants and Nurse Practitioners) who all work together to provide you with the care you need, when you need it.  We recommend signing up for the patient portal called MyChart.  Sign up information is provided on this After Visit Summary.  MyChart is used to connect with patients for Virtual Visits (Telemedicine).  Patients are able to view lab/test results, encounter notes, upcoming appointments, etc.  Non-urgent messages can be sent to your provider as well.   To learn more about what you can do with MyChart, go to [email or web address].    Your next appointment:   6 month(s)  Provider:   You may see Elspeth Sage, MD or one of the following Advanced Practice Providers on your designated Care Team:   Charlies Arthur, PA-C Michael Andy Tillery, PA-C Daphne Barrack, NP  Other Instructions

## 2024-01-01 DIAGNOSIS — L84 Corns and callosities: Secondary | ICD-10-CM | POA: Diagnosis not present

## 2024-01-01 DIAGNOSIS — M722 Plantar fascial fibromatosis: Secondary | ICD-10-CM | POA: Insufficient documentation

## 2024-01-02 DIAGNOSIS — M6530 Trigger finger, unspecified finger: Secondary | ICD-10-CM | POA: Diagnosis not present

## 2024-01-02 DIAGNOSIS — K219 Gastro-esophageal reflux disease without esophagitis: Secondary | ICD-10-CM | POA: Diagnosis not present

## 2024-01-02 DIAGNOSIS — M79642 Pain in left hand: Secondary | ICD-10-CM | POA: Diagnosis not present

## 2024-01-02 DIAGNOSIS — M79645 Pain in left finger(s): Secondary | ICD-10-CM | POA: Diagnosis not present

## 2024-01-02 DIAGNOSIS — M1990 Unspecified osteoarthritis, unspecified site: Secondary | ICD-10-CM | POA: Diagnosis not present

## 2024-01-02 DIAGNOSIS — M064 Inflammatory polyarthropathy: Secondary | ICD-10-CM | POA: Diagnosis not present

## 2024-01-22 DIAGNOSIS — E6609 Other obesity due to excess calories: Secondary | ICD-10-CM | POA: Diagnosis not present

## 2024-01-22 DIAGNOSIS — M722 Plantar fascial fibromatosis: Secondary | ICD-10-CM | POA: Diagnosis not present

## 2024-01-22 DIAGNOSIS — R42 Dizziness and giddiness: Secondary | ICD-10-CM | POA: Diagnosis not present

## 2024-01-22 DIAGNOSIS — R61 Generalized hyperhidrosis: Secondary | ICD-10-CM | POA: Diagnosis not present

## 2024-01-22 DIAGNOSIS — I10 Essential (primary) hypertension: Secondary | ICD-10-CM | POA: Diagnosis not present

## 2024-01-22 DIAGNOSIS — E1165 Type 2 diabetes mellitus with hyperglycemia: Secondary | ICD-10-CM | POA: Diagnosis not present

## 2024-01-28 DIAGNOSIS — H43813 Vitreous degeneration, bilateral: Secondary | ICD-10-CM | POA: Diagnosis not present

## 2024-01-28 DIAGNOSIS — I10 Essential (primary) hypertension: Secondary | ICD-10-CM | POA: Diagnosis not present

## 2024-01-28 DIAGNOSIS — H3582 Retinal ischemia: Secondary | ICD-10-CM | POA: Diagnosis not present

## 2024-01-28 DIAGNOSIS — H25813 Combined forms of age-related cataract, bilateral: Secondary | ICD-10-CM | POA: Diagnosis not present

## 2024-01-28 DIAGNOSIS — E113313 Type 2 diabetes mellitus with moderate nonproliferative diabetic retinopathy with macular edema, bilateral: Secondary | ICD-10-CM | POA: Diagnosis not present

## 2024-01-28 DIAGNOSIS — H35372 Puckering of macula, left eye: Secondary | ICD-10-CM | POA: Diagnosis not present

## 2024-02-12 DIAGNOSIS — M722 Plantar fascial fibromatosis: Secondary | ICD-10-CM | POA: Diagnosis not present

## 2024-02-17 DIAGNOSIS — M722 Plantar fascial fibromatosis: Secondary | ICD-10-CM | POA: Diagnosis not present

## 2024-03-05 DIAGNOSIS — H524 Presbyopia: Secondary | ICD-10-CM | POA: Diagnosis not present

## 2024-03-05 DIAGNOSIS — H25013 Cortical age-related cataract, bilateral: Secondary | ICD-10-CM | POA: Diagnosis not present

## 2024-03-05 DIAGNOSIS — H2513 Age-related nuclear cataract, bilateral: Secondary | ICD-10-CM | POA: Diagnosis not present

## 2024-03-05 DIAGNOSIS — E113213 Type 2 diabetes mellitus with mild nonproliferative diabetic retinopathy with macular edema, bilateral: Secondary | ICD-10-CM | POA: Diagnosis not present

## 2024-03-05 DIAGNOSIS — H52203 Unspecified astigmatism, bilateral: Secondary | ICD-10-CM | POA: Diagnosis not present

## 2024-03-05 DIAGNOSIS — H40053 Ocular hypertension, bilateral: Secondary | ICD-10-CM | POA: Diagnosis not present

## 2024-03-23 ENCOUNTER — Other Ambulatory Visit: Payer: Self-pay | Admitting: Internal Medicine

## 2024-03-23 DIAGNOSIS — Z1231 Encounter for screening mammogram for malignant neoplasm of breast: Secondary | ICD-10-CM

## 2024-03-31 DIAGNOSIS — H3582 Retinal ischemia: Secondary | ICD-10-CM | POA: Diagnosis not present

## 2024-03-31 DIAGNOSIS — H35372 Puckering of macula, left eye: Secondary | ICD-10-CM | POA: Diagnosis not present

## 2024-03-31 DIAGNOSIS — H43813 Vitreous degeneration, bilateral: Secondary | ICD-10-CM | POA: Diagnosis not present

## 2024-03-31 DIAGNOSIS — H25813 Combined forms of age-related cataract, bilateral: Secondary | ICD-10-CM | POA: Diagnosis not present

## 2024-03-31 DIAGNOSIS — E113313 Type 2 diabetes mellitus with moderate nonproliferative diabetic retinopathy with macular edema, bilateral: Secondary | ICD-10-CM | POA: Diagnosis not present

## 2024-03-31 DIAGNOSIS — I10 Essential (primary) hypertension: Secondary | ICD-10-CM | POA: Diagnosis not present

## 2024-04-14 DIAGNOSIS — H2513 Age-related nuclear cataract, bilateral: Secondary | ICD-10-CM | POA: Diagnosis not present

## 2024-04-14 DIAGNOSIS — H25013 Cortical age-related cataract, bilateral: Secondary | ICD-10-CM | POA: Diagnosis not present

## 2024-05-04 DIAGNOSIS — H52222 Regular astigmatism, left eye: Secondary | ICD-10-CM | POA: Diagnosis not present

## 2024-05-04 DIAGNOSIS — H25012 Cortical age-related cataract, left eye: Secondary | ICD-10-CM | POA: Diagnosis not present

## 2024-05-04 DIAGNOSIS — H25812 Combined forms of age-related cataract, left eye: Secondary | ICD-10-CM | POA: Diagnosis not present

## 2024-05-04 DIAGNOSIS — H2512 Age-related nuclear cataract, left eye: Secondary | ICD-10-CM | POA: Diagnosis not present

## 2024-05-04 DIAGNOSIS — E1136 Type 2 diabetes mellitus with diabetic cataract: Secondary | ICD-10-CM | POA: Diagnosis not present

## 2024-05-04 DIAGNOSIS — I10 Essential (primary) hypertension: Secondary | ICD-10-CM | POA: Diagnosis not present

## 2024-05-04 DIAGNOSIS — Z961 Presence of intraocular lens: Secondary | ICD-10-CM | POA: Diagnosis not present

## 2024-05-11 ENCOUNTER — Ambulatory Visit

## 2024-05-18 DIAGNOSIS — Z961 Presence of intraocular lens: Secondary | ICD-10-CM | POA: Diagnosis not present

## 2024-05-18 DIAGNOSIS — H25811 Combined forms of age-related cataract, right eye: Secondary | ICD-10-CM | POA: Diagnosis not present

## 2024-05-18 DIAGNOSIS — H52221 Regular astigmatism, right eye: Secondary | ICD-10-CM | POA: Diagnosis not present

## 2024-05-18 DIAGNOSIS — H25011 Cortical age-related cataract, right eye: Secondary | ICD-10-CM | POA: Diagnosis not present

## 2024-05-18 DIAGNOSIS — H2511 Age-related nuclear cataract, right eye: Secondary | ICD-10-CM | POA: Diagnosis not present

## 2024-05-20 ENCOUNTER — Telehealth: Payer: Self-pay | Admitting: Internal Medicine

## 2024-05-20 DIAGNOSIS — I10 Essential (primary) hypertension: Secondary | ICD-10-CM

## 2024-05-20 DIAGNOSIS — I951 Orthostatic hypotension: Secondary | ICD-10-CM

## 2024-05-20 NOTE — Telephone Encounter (Signed)
 Pt reports dizziness when she stands and BP drops.  On 6/18 she did all the BP checks due to dizzy spells.  Was not dizzy w 195/ BP.   The daily readings listed in message are in mornings before any morning medications.    Asked about hydration.   Just had cataract surgery and has been sleeping a lot and just hasn't felt well overall.  Not eating or drinking as much as normal.  I encourage her to really increase her fluid intake as dehydration could be causing all of those symptoms.  Also reached out to PharmD to see if we could get her seen in HTN clinic in the next week or so.  Awaiting response.  Pt aware of this plan and that we will call her with an appointment.  Referral placed.

## 2024-05-20 NOTE — Telephone Encounter (Signed)
 Patient was send the BP dot phrase yesterday and responded with the following via pt schedule. Please advise.  I've been instructed to check my BP first thing every morning.  If it is 180 or more I'm to take 1 hydrazine. This is what I did on 6/18. At 9 am my BP was 195/89. At 11am it was 124/65. t 1 pm it was 97/57. At 2:30 it was 87/56. At 7 pm it was 130/75 and time to take my night BP medicine   This is a continuous situation. BP FOR LAST 5 DAYS 137/65 177/91 168/89 162/81 157/80 And today was 176/95 When my BP drops I get dizzy or faint. I never know when this is going to happen making it hard to get anything accomplished. It has been over a year since I've seen Dr Fernande. I'm pretty sure I'm taking 2-3 different medications for my BP I've had sever headaches in the back of my head (upper neck area) pretty much on the left side. I just feel something needs to be changed

## 2024-05-21 ENCOUNTER — Ambulatory Visit: Attending: Cardiovascular Disease | Admitting: Pharmacist

## 2024-05-21 ENCOUNTER — Encounter: Payer: Self-pay | Admitting: Pharmacist

## 2024-05-21 VITALS — BP 158/84 | HR 80

## 2024-05-21 DIAGNOSIS — I10 Essential (primary) hypertension: Secondary | ICD-10-CM

## 2024-05-21 DIAGNOSIS — K219 Gastro-esophageal reflux disease without esophagitis: Secondary | ICD-10-CM | POA: Insufficient documentation

## 2024-05-21 DIAGNOSIS — E119 Type 2 diabetes mellitus without complications: Secondary | ICD-10-CM | POA: Insufficient documentation

## 2024-05-21 MED ORDER — OLMESARTAN MEDOXOMIL 40 MG PO TABS
40.0000 mg | ORAL_TABLET | Freq: Every day | ORAL | 5 refills | Status: AC
Start: 2024-05-21 — End: ?

## 2024-05-21 NOTE — Telephone Encounter (Signed)
 Pt has been scheduled to see out HTN/PharmD clinic on 05/21/24.

## 2024-05-21 NOTE — Progress Notes (Signed)
 Patient ID: [name]                 DOB: 02/04/1951                      MRN: [id number]     HPI: [name] is a 73 y.o. female referred to HTN clinic from RN. Patient of Dr Fernande. PMH is significant for HTN, orthostatic HTN, and T2DM.  Patient contacted clinic earlier this week complaining of orthostatic symptoms. BP elevated upon awakening and then dropping after morning medications. Symptomatic, dizzy.  Currently on spironolactone 12.5mg  in the morning, olmesartan 20mg  in evening and hydralazine 50mg  for SBP >180, 25mg  for SBP>150.   Did not take hydralazine this morning because she needed to drive to appt.   Recent BP readings upon awakening: 6/25 176/95 6/24 160/82 6/23 157/80 6/22 162/81  Has PCP appointment next week and will have lab work updated.   Has not checked her blood sugar when feeling dizzy. Is on glipizide twice daily and Lantus.  Current HTN meds: Olmesartan 20mg  daily Spironolactone 12.5mg  daily Hydralazine 50mg  depending on SBP  Wt Readings from Last 3 Encounters:  12/04/23 191 lb 6.4 oz (86.8 kg)  09/03/23 187 lb 6.4 oz (85 kg)  06/09/23 179 lb (81.2 kg)   BP Readings from Last 3 Encounters:  05/21/24 (!) 158/84  12/04/23 138/66  09/03/23 (!) 146/80   Pulse Readings from Last 3 Encounters:  05/21/24 80  12/04/23 89  09/03/23 82    Renal function: CrCl cannot be calculated (Patient's most recent lab result is older than the maximum 21 days allowed.).  Past Medical History:  Diagnosis Date   Arthritis    Background diabetic retinopathy (HCC)    Breast cancer (HCC) 1996   Right Breast Cancer   Cancer (HCC) 1996   breast    Chronic major depressive disorder, single episode    Depression    Diabetes mellitus    Diabetic retinopathy associated with diabetes mellitus due to underlying condition (HCC)    DJD (degenerative joint disease)    Drug-induced myopathy    Eczema    GERD (gastroesophageal reflux disease)     Hemorrhage of anus and rectum    HTN (hypertension)    Hyperlipidemia    Kidney stone 05/2012   Low back pain    Major depressive disorder, recurrent episode, in full remission (HCC)    Migraine    Orthostatic dizziness    Osteoarthritis of first carpometacarpal joint, unspecified    Osteoarthritis of knee, unspecified    Phobic anxiety disorder    Pure hypercholesterolemia    Stage 3a chronic kidney disease (HCC)    Vertigo    Vitamin D deficiency     Current Outpatient Medications on File Prior to Visit  Medication Sig Dispense Refill   acetaminophen (QC ACETAMINOPHEN 8HR ARTH PAIN) 650 MG CR tablet Take 1,300 mg by mouth in the morning and at bedtime.     aspirin  81 MG tablet Take 1 tablet (81 mg total) by mouth daily. 30 tablet 0   Biotin 89999 MCG TABS Take 1 tablet by mouth daily.     calcium-vitamin D (OSCAL WITH D) 500-200 MG-UNIT per tablet Take 1 tablet by mouth.     celecoxib (CELEBREX) 100 MG capsule Take 100 mg by mouth 2 (two) times daily.     cephALEXin (KEFLEX) 500 MG capsule Take 500 mg by mouth. Take 4 tabs 1 hour before  dental appointments     cetirizine (ZYRTEC) 10 MG tablet daily.     Cholecalciferol (VITAMIN D3) 125 MCG (5000 UT) TABS Take 1 capsule by mouth 2 (two) times daily.      fluticasone (FLONASE) 50 MCG/ACT nasal spray Place 2 sprays into both nostrils daily.      glucose blood test strip 1 each by Other route 2 (two) times daily before a meal. Use as instructed     hydrALAZINE (APRESOLINE) 50 MG tablet Take 50 mg by mouth 2 (two) times daily.     insulin  glargine (LANTUS) 100 UNIT/ML injection Inject 70 Units into the skin at bedtime.     omeprazole (PRILOSEC) 20 MG capsule Take 20 mg by mouth 2 (two) times daily in the am and at bedtime..      rosuvastatin (CRESTOR) 5 MG tablet Take 1 tablet by mouth 3 (three) times a week.      SODIUM FLUORIDE 5000 PPM 1.1 % PSTE Take by mouth at bedtime.     spironolactone (ALDACTONE) 25 MG tablet Take 12.5 mg  by mouth daily.     tobramycin (TOBREX) 0.3 % ophthalmic solution Place into both eyes as directed. Before procedure     traZODone (DESYREL) 50 MG tablet as needed for sleep.     venlafaxine XR (EFFEXOR-XR) 150 MG 24 hr capsule Take 150 mg by mouth in the morning and at bedtime.      No current facility-administered medications on file prior to visit.    Allergies  Allergen Reactions   Vicodin [Hydrocodone-Acetaminophen] Itching    Facial swelling    Beta Adrenergic Blockers Other (See Comments)    Intolerance of the past   Dilaudid [Hydromorphone Hcl] Itching   Entex Lq [Phenylephrine-Guaifenesin] Hypertension   Hydromorphone Hcl Itching   Lipitor [Atorvastatin] Other (See Comments)    Leg pain   Percocet [Oxycodone-Acetaminophen] Itching   Protonix [Pantoprazole Sodium]    Sulfa Antibiotics Nausea Only   Telmisartan     Other Reaction(s): Unknown   Trulicity [Dulaglutide] Swelling    Tongue Swelling   Wellbutrin [Bupropion] Other (See Comments)    Depression worsened   Erythromycin Other (See Comments)    Upset stomach   Metformin And Related Diarrhea   Morphine Rash   Morphine And Codeine Rash   Vasotec [Enalapril] Palpitations   Vicodin [Hydrocodone-Acetaminophen] Swelling and Rash     Assessment/Plan:  1. Hypertension -  HYPERTENSION CONTROL Vitals:   05/21/24 1122 05/21/24 1141  BP: (!) 151/97 (!) 158/84    The patient's blood pressure is elevated above target today.  In order to address the patient's elevated BP: A current anti-hypertensive medication was adjusted today.    Patient BP 151/97 in room but has not taken any antihypertensives today due to fear of BP dropping. Appears very sensitive to hydralazine. Will have patient hold for now. Will increase olmesartan to 40mg  in evening and continue spiro 12.5mg  in morning. Also recommend she check her blood glucose if she feels dizzy or tired in case of hypoglycemia. Will check on lab work from PCP next  week.  Continue spironolacton 12.5mg  daily Increase olmesartan to 40mg  daily Hold hydralazine for now  Medford Bolk, PharmD, BCACP, CDCES, CPP [facility] [address] [place], [place] [address] Phone: [phone]; Fax: [phone] 05/21/2024 2:29 PM

## 2024-05-21 NOTE — Patient Instructions (Addendum)
 It was nice meeting you two today  Hold your hydralazine for the time being  We will increase your olmesartan to 40mg  in the evening  Continue your spironolactone 12.5mg  once a day in the morning  Let us  know if you become dizzy again and what your recent blood pressure readings are. Also keep an eye on your blood sugar if you feel dizzy  Continue to hydrate. Continue drinking water and crystal light  Please let us  know if you have any other concerns  Medford Bolk, PharmD, BCACP, CDCES, CPP [facility] [address] [place], [place] [address] Phone: [phone]; Fax: [phone] 05/21/2024 11:39 AM

## 2024-05-25 DIAGNOSIS — M85851 Other specified disorders of bone density and structure, right thigh: Secondary | ICD-10-CM | POA: Diagnosis not present

## 2024-05-25 DIAGNOSIS — Z1331 Encounter for screening for depression: Secondary | ICD-10-CM | POA: Diagnosis not present

## 2024-05-25 DIAGNOSIS — E785 Hyperlipidemia, unspecified: Secondary | ICD-10-CM | POA: Diagnosis not present

## 2024-05-25 DIAGNOSIS — K219 Gastro-esophageal reflux disease without esophagitis: Secondary | ICD-10-CM | POA: Diagnosis not present

## 2024-05-25 DIAGNOSIS — Z79899 Other long term (current) drug therapy: Secondary | ICD-10-CM | POA: Diagnosis not present

## 2024-05-25 DIAGNOSIS — E6609 Other obesity due to excess calories: Secondary | ICD-10-CM | POA: Diagnosis not present

## 2024-05-25 DIAGNOSIS — R42 Dizziness and giddiness: Secondary | ICD-10-CM | POA: Diagnosis not present

## 2024-05-25 DIAGNOSIS — E1165 Type 2 diabetes mellitus with hyperglycemia: Secondary | ICD-10-CM | POA: Diagnosis not present

## 2024-05-25 DIAGNOSIS — I10 Essential (primary) hypertension: Secondary | ICD-10-CM | POA: Diagnosis not present

## 2024-05-25 DIAGNOSIS — Z0000 Encounter for general adult medical examination without abnormal findings: Secondary | ICD-10-CM | POA: Diagnosis not present

## 2024-05-26 ENCOUNTER — Ambulatory Visit
Admission: RE | Admit: 2024-05-26 | Discharge: 2024-05-26 | Disposition: A | Discharge status: Home / Self Care | Source: Ambulatory Visit | Attending: Internal Medicine | Admitting: Internal Medicine

## 2024-05-26 ENCOUNTER — Other Ambulatory Visit: Payer: Self-pay | Admitting: Internal Medicine

## 2024-05-26 DIAGNOSIS — Z1231 Encounter for screening mammogram for malignant neoplasm of breast: Secondary | ICD-10-CM

## 2024-05-28 DIAGNOSIS — E875 Hyperkalemia: Secondary | ICD-10-CM | POA: Diagnosis not present

## 2024-06-02 DIAGNOSIS — H35372 Puckering of macula, left eye: Secondary | ICD-10-CM | POA: Diagnosis not present

## 2024-06-02 DIAGNOSIS — H3582 Retinal ischemia: Secondary | ICD-10-CM | POA: Diagnosis not present

## 2024-06-02 DIAGNOSIS — H43813 Vitreous degeneration, bilateral: Secondary | ICD-10-CM | POA: Diagnosis not present

## 2024-06-02 DIAGNOSIS — E113313 Type 2 diabetes mellitus with moderate nonproliferative diabetic retinopathy with macular edema, bilateral: Secondary | ICD-10-CM | POA: Diagnosis not present

## 2024-06-04 NOTE — Progress Notes (Signed)
  Electrophysiology Office Note:   Date:  06/05/2024  ID:  [name], DOB 01/06/1951, MRN [id number]  Primary Cardiologist: None Primary Heart Failure: None Electrophysiologist: Elspeth Sage, MD      History of Present Illness:   [name] is a 73 y.o. female with h/o HTN, DM II, GERD, breast CA s/p partial mastectomy, CKD III seen today for routine electrophysiology followup.   Seen in 05/2023 with labile BP.  Medications adjusted. Returned 11/2023 with significant improvement in symptoms.    Since last being seen in our clinic the patient reports doing very well. She states she feels wonderful. Her PCP stopped her spironolactone due to elevated potassium.  She does not have issues with swelling normally. She previously was on hydrochlorothiazide and had orthostatic hypotension with it and it was also stopped after pharmacy review 05/21/24  She denies chest pain, palpitations, dyspnea, PND, orthopnea, nausea, vomiting, dizziness, syncope, edema, weight gain, or early satiety.   Review of systems complete and found to be negative unless listed in HPI.   EP Information / Studies Reviewed:    EKG is ordered today. Personal review as below.  EKG Interpretation Date/Time:  Friday June 05 2024 08:30:44 EDT Ventricular Rate:  77 PR Interval:  174 QRS Duration:  86 QT Interval:  380 QTC Calculation: 430 R Axis:   36  Text Interpretation: Normal sinus rhythm Confirmed by Aniceto Jarvis ([id number]) on 06/05/2024 8:45:36 AM   Studies:  PYP 01/2023 > unlikely amyloid  ECHO 01/2023 > LVEF 60-65% cMRI 03/2023 > LVEF 56%, LVH, no delayed myocardia enhancement, not c/w amyloid    Risk Assessment/Calculations:              Physical Exam:   VS:  BP 134/70   Pulse 77   Ht 5' 3.5 (1.613 m)   Wt 195 lb (88.5 kg)   SpO2 96%   BMI 34.00 kg/m    Wt Readings from Last 3 Encounters:  06/05/24 195 lb (88.5 kg)  12/04/23 191 lb 6.4 oz (86.8 kg)  09/03/23 187 lb 6.4 oz (85 kg)      GEN: pleasant, well nourished, well developed in no acute distress NECK: No JVD; No carotid bruits CARDIAC: Regular rate and rhythm, no murmurs, rubs, gallops RESPIRATORY:  Clear to auscultation without rales, wheezing or rhonchi  ABDOMEN: Soft, non-tender, non-distended EXTREMITIES:  No edema; No deformity   ASSESSMENT AND PLAN:    Hypertension  Hx Orthostatic Hypotension  LVH  -continue Benicar  40 mg daily  -prior issues with orthostatic hypotension on multiple agents > hydrochlorothiazide stopped due to drop in BP after taking in 04/2024 and spironolactone stopped by PCP with hyperkalemia -PCP monitoring labs -BP well controlled on current regimen with no issues of orthostasis    Pharmacy assisted patient with applying Dexcom at pt's request.    Follow up with Dr. Jeffrie in 12 months for hypertension management.   Signed, Jarvis Aniceto, NP-C, AGACNP-BC [facility] - Electrophysiology  06/05/2024, 9:11 AM

## 2024-06-05 ENCOUNTER — Encounter: Payer: Self-pay | Admitting: Pulmonary Disease

## 2024-06-05 ENCOUNTER — Ambulatory Visit: Attending: Pulmonary Disease | Admitting: Pulmonary Disease

## 2024-06-05 VITALS — BP 134/70 | HR 77 | Ht 63.5 in | Wt 195.0 lb

## 2024-06-05 DIAGNOSIS — I517 Cardiomegaly: Secondary | ICD-10-CM

## 2024-06-05 DIAGNOSIS — I10 Essential (primary) hypertension: Secondary | ICD-10-CM

## 2024-06-05 DIAGNOSIS — I951 Orthostatic hypotension: Secondary | ICD-10-CM

## 2024-06-05 DIAGNOSIS — G901 Familial dysautonomia [Riley-Day]: Secondary | ICD-10-CM

## 2024-06-05 NOTE — Patient Instructions (Signed)
 Medication Instructions:  Your physician recommends that you continue on your current medications as directed. Please refer to the Current Medication list given to you today.  *If you need a refill on your cardiac medications before your next appointment, please call your pharmacy*  Lab Work: None ordered If you have labs (blood work) drawn today and your tests are completely normal, you will receive your results only by: MyChart Message (if you have MyChart) OR A paper copy in the mail If you have any lab test that is abnormal or we need to change your treatment, we will call you to review the results.  Follow-Up: At [facility], you and your health needs are our priority.  As part of our continuing mission to provide you with exceptional heart care, our providers are all part of one team.  This team includes your primary Cardiologist (physician) and Advanced Practice Providers or APPs (Physician Assistants and Nurse Practitioners) who all work together to provide you with the care you need, when you need it.  Your next appointment:   1 year(s)  Provider:   Dr Oneil Parchment

## 2024-08-11 DIAGNOSIS — H3582 Retinal ischemia: Secondary | ICD-10-CM | POA: Diagnosis not present

## 2024-08-11 DIAGNOSIS — H35372 Puckering of macula, left eye: Secondary | ICD-10-CM | POA: Diagnosis not present

## 2024-08-11 DIAGNOSIS — E113313 Type 2 diabetes mellitus with moderate nonproliferative diabetic retinopathy with macular edema, bilateral: Secondary | ICD-10-CM | POA: Diagnosis not present

## 2024-08-11 DIAGNOSIS — H43813 Vitreous degeneration, bilateral: Secondary | ICD-10-CM | POA: Diagnosis not present

## 2024-08-19 DIAGNOSIS — E1165 Type 2 diabetes mellitus with hyperglycemia: Secondary | ICD-10-CM | POA: Diagnosis not present

## 2024-08-19 DIAGNOSIS — E785 Hyperlipidemia, unspecified: Secondary | ICD-10-CM | POA: Diagnosis not present

## 2024-08-19 DIAGNOSIS — K5909 Other constipation: Secondary | ICD-10-CM | POA: Diagnosis not present

## 2024-08-19 DIAGNOSIS — R42 Dizziness and giddiness: Secondary | ICD-10-CM | POA: Diagnosis not present

## 2024-08-19 DIAGNOSIS — Z23 Encounter for immunization: Secondary | ICD-10-CM | POA: Diagnosis not present

## 2024-08-19 DIAGNOSIS — I10 Essential (primary) hypertension: Secondary | ICD-10-CM | POA: Diagnosis not present

## 2024-08-28 DIAGNOSIS — E785 Hyperlipidemia, unspecified: Secondary | ICD-10-CM | POA: Diagnosis not present

## 2024-08-28 DIAGNOSIS — E1165 Type 2 diabetes mellitus with hyperglycemia: Secondary | ICD-10-CM | POA: Diagnosis not present

## 2024-08-28 DIAGNOSIS — I10 Essential (primary) hypertension: Secondary | ICD-10-CM | POA: Diagnosis not present

## 2024-10-06 DIAGNOSIS — M6530 Trigger finger, unspecified finger: Secondary | ICD-10-CM | POA: Diagnosis not present

## 2024-10-06 DIAGNOSIS — M1990 Unspecified osteoarthritis, unspecified site: Secondary | ICD-10-CM | POA: Diagnosis not present

## 2024-10-06 DIAGNOSIS — M79642 Pain in left hand: Secondary | ICD-10-CM | POA: Diagnosis not present

## 2024-10-06 DIAGNOSIS — Z853 Personal history of malignant neoplasm of breast: Secondary | ICD-10-CM | POA: Diagnosis not present

## 2024-10-13 DIAGNOSIS — E113313 Type 2 diabetes mellitus with moderate nonproliferative diabetic retinopathy with macular edema, bilateral: Secondary | ICD-10-CM | POA: Diagnosis not present

## 2024-10-13 DIAGNOSIS — H35372 Puckering of macula, left eye: Secondary | ICD-10-CM | POA: Diagnosis not present

## 2024-10-13 DIAGNOSIS — H43813 Vitreous degeneration, bilateral: Secondary | ICD-10-CM | POA: Diagnosis not present

## 2024-10-13 DIAGNOSIS — H3582 Retinal ischemia: Secondary | ICD-10-CM | POA: Diagnosis not present

## 2024-10-28 DIAGNOSIS — I10 Essential (primary) hypertension: Secondary | ICD-10-CM | POA: Diagnosis not present

## 2024-10-28 DIAGNOSIS — E1165 Type 2 diabetes mellitus with hyperglycemia: Secondary | ICD-10-CM | POA: Diagnosis not present

## 2024-10-28 DIAGNOSIS — L659 Nonscarring hair loss, unspecified: Secondary | ICD-10-CM | POA: Diagnosis not present

## 2024-10-28 DIAGNOSIS — R42 Dizziness and giddiness: Secondary | ICD-10-CM | POA: Diagnosis not present

## 2024-10-28 DIAGNOSIS — E785 Hyperlipidemia, unspecified: Secondary | ICD-10-CM | POA: Diagnosis not present
